# Patient Record
Sex: Female | Born: 1940 | Race: White | Hispanic: No | Marital: Married | Smoking: Light tobacco smoker
Health system: Southern US, Community
[De-identification: ages and names within clinical notes are randomized; demographics above are authoritative.]

## PROBLEM LIST (undated history)

## (undated) DIAGNOSIS — G43909 Migraine, unspecified, not intractable, without status migrainosus: Secondary | ICD-10-CM

## (undated) DIAGNOSIS — K222 Esophageal obstruction: Secondary | ICD-10-CM

## (undated) DIAGNOSIS — H833X9 Noise effects on inner ear, unspecified ear: Secondary | ICD-10-CM

## (undated) DIAGNOSIS — R0989 Other specified symptoms and signs involving the circulatory and respiratory systems: Secondary | ICD-10-CM

## (undated) DIAGNOSIS — K449 Diaphragmatic hernia without obstruction or gangrene: Secondary | ICD-10-CM

## (undated) DIAGNOSIS — K259 Gastric ulcer, unspecified as acute or chronic, without hemorrhage or perforation: Secondary | ICD-10-CM

## (undated) HISTORY — DX: Gastric ulcer, unspecified as acute or chronic, without hemorrhage or perforation: K25.9

## (undated) HISTORY — DX: Diaphragmatic hernia without obstruction or gangrene: K44.9

## (undated) HISTORY — DX: Migraine, unspecified, not intractable, without status migrainosus: G43.909

## (undated) HISTORY — DX: Other specified symptoms and signs involving the circulatory and respiratory systems: R09.89

## (undated) HISTORY — DX: Noise effects on inner ear, unspecified ear: H83.3X9

## (undated) HISTORY — DX: Esophageal obstruction: K22.2

---

## 1968-03-24 HISTORY — PX: ABDOMINAL HYSTERECTOMY: SHX81

## 2005-07-31 ENCOUNTER — Ambulatory Visit: Payer: Self-pay | Admitting: Family Medicine

## 2006-03-10 ENCOUNTER — Telehealth: Payer: Self-pay | Admitting: Family Medicine

## 2006-03-10 ENCOUNTER — Ambulatory Visit: Payer: Self-pay | Admitting: Family Medicine

## 2006-03-10 DIAGNOSIS — I10 Essential (primary) hypertension: Secondary | ICD-10-CM | POA: Insufficient documentation

## 2006-03-10 DIAGNOSIS — G43109 Migraine with aura, not intractable, without status migrainosus: Secondary | ICD-10-CM | POA: Insufficient documentation

## 2006-12-01 ENCOUNTER — Ambulatory Visit: Payer: Self-pay | Admitting: Family Medicine

## 2006-12-01 DIAGNOSIS — H9190 Unspecified hearing loss, unspecified ear: Secondary | ICD-10-CM | POA: Insufficient documentation

## 2006-12-04 ENCOUNTER — Telehealth: Payer: Self-pay | Admitting: Family Medicine

## 2006-12-04 DIAGNOSIS — I6529 Occlusion and stenosis of unspecified carotid artery: Secondary | ICD-10-CM | POA: Insufficient documentation

## 2006-12-08 ENCOUNTER — Encounter: Payer: Self-pay | Admitting: Family Medicine

## 2006-12-09 ENCOUNTER — Encounter: Payer: Self-pay | Admitting: Family Medicine

## 2006-12-09 DIAGNOSIS — E785 Hyperlipidemia, unspecified: Secondary | ICD-10-CM | POA: Insufficient documentation

## 2006-12-09 LAB — CONVERTED CEMR LAB
ALT: 8 units/L (ref 0–35)
CO2: 21 meq/L (ref 19–32)
Cholesterol, target level: 200 mg/dL
Cholesterol: 255 mg/dL — ABNORMAL HIGH (ref 0–200)
Creatinine, Ser: 0.94 mg/dL (ref 0.40–1.20)
HDL: 36 mg/dL — ABNORMAL LOW (ref >39)
Total Bilirubin: 0.3 mg/dL (ref 0.3–1.2)
Total CHOL/HDL Ratio: 7.1
VLDL: 44 mg/dL — ABNORMAL HIGH (ref 0–40)

## 2007-01-20 ENCOUNTER — Encounter: Payer: Self-pay | Admitting: Family Medicine

## 2007-08-05 ENCOUNTER — Encounter: Payer: Self-pay | Admitting: Family Medicine

## 2007-09-27 ENCOUNTER — Ambulatory Visit: Payer: Self-pay | Admitting: Family Medicine

## 2007-10-07 ENCOUNTER — Encounter: Payer: Self-pay | Admitting: Family Medicine

## 2007-10-19 ENCOUNTER — Telehealth: Payer: Self-pay | Admitting: Family Medicine

## 2007-12-23 ENCOUNTER — Encounter: Payer: Self-pay | Admitting: Family Medicine

## 2007-12-24 LAB — CONVERTED CEMR LAB
Albumin: 4.2 g/dL (ref 3.5–5.2)
Alkaline Phosphatase: 64 units/L (ref 39–117)
BUN: 22 mg/dL (ref 6–23)
CO2: 23 meq/L (ref 19–32)
Calcium: 9.7 mg/dL (ref 8.4–10.5)
Chloride: 104 meq/L (ref 96–112)
Cholesterol: 196 mg/dL (ref 0–200)
Glucose, Bld: 101 mg/dL — ABNORMAL HIGH (ref 70–99)
HDL: 44 mg/dL (ref >39)
Potassium: 4.8 meq/L (ref 3.5–5.3)
Triglycerides: 212 mg/dL — ABNORMAL HIGH (ref <150)

## 2008-08-29 ENCOUNTER — Ambulatory Visit: Payer: Self-pay | Admitting: Family Medicine

## 2008-08-29 ENCOUNTER — Encounter: Admission: RE | Admit: 2008-08-29 | Discharge: 2008-08-29 | Payer: Self-pay | Admitting: Family Medicine

## 2008-09-08 ENCOUNTER — Telehealth: Payer: Self-pay | Admitting: Family Medicine

## 2008-09-14 ENCOUNTER — Encounter: Payer: Self-pay | Admitting: Family Medicine

## 2008-10-13 ENCOUNTER — Ambulatory Visit: Payer: Self-pay | Admitting: Vascular Surgery

## 2008-10-13 ENCOUNTER — Encounter: Admission: RE | Admit: 2008-10-13 | Discharge: 2008-10-13 | Payer: Self-pay | Admitting: Family Medicine

## 2008-10-13 ENCOUNTER — Ambulatory Visit: Payer: Self-pay | Admitting: Family Medicine

## 2008-10-13 LAB — CONVERTED CEMR LAB
Basophils Relative: 0 % (ref 0–1)
Eosinophils Absolute: 0 10*3/uL (ref 0.0–0.7)
HCT: 43 % (ref 36.0–46.0)
Hemoglobin: 14.1 g/dL (ref 12.0–15.0)
Lymphs Abs: 1.7 10*3/uL (ref 0.7–4.0)
MCHC: 32.8 g/dL (ref 30.0–36.0)
MCV: 100.6 fL — ABNORMAL HIGH (ref 78.0–100.0)
Monocytes Absolute: 0.5 10*3/uL (ref 0.1–1.0)
Monocytes Relative: 6 % (ref 3–12)
RBC: 4.27 M/uL (ref 3.87–5.11)
WBC: 7.6 10*3/uL (ref 4.0–10.5)

## 2008-10-16 LAB — CONVERTED CEMR LAB
ALT: <8 units/L (ref 0–35)
AST: 14 units/L (ref 0–37)
Albumin: 3.9 g/dL (ref 3.5–5.2)
BUN: 14 mg/dL (ref 6–23)
CO2: 22 meq/L (ref 19–32)
Calcium: 9.2 mg/dL (ref 8.4–10.5)
Chloride: 108 meq/L (ref 96–112)
Creatinine, Ser: 1.13 mg/dL (ref 0.40–1.20)
Potassium: 4.5 meq/L (ref 3.5–5.3)
Sed Rate: 20 mm/hr (ref 0–22)
Uric Acid, Serum: 3.1 mg/dL (ref 2.4–7.0)

## 2008-12-23 ENCOUNTER — Encounter: Payer: Self-pay | Admitting: Family Medicine

## 2009-02-02 ENCOUNTER — Ambulatory Visit: Payer: Self-pay | Admitting: Family Medicine

## 2009-02-02 DIAGNOSIS — K625 Hemorrhage of anus and rectum: Secondary | ICD-10-CM

## 2009-02-02 DIAGNOSIS — K222 Esophageal obstruction: Secondary | ICD-10-CM

## 2009-02-09 ENCOUNTER — Ambulatory Visit: Payer: Self-pay | Admitting: Family Medicine

## 2009-02-22 ENCOUNTER — Encounter: Payer: Self-pay | Admitting: Family Medicine

## 2009-03-24 DIAGNOSIS — K222 Esophageal obstruction: Secondary | ICD-10-CM

## 2009-03-24 HISTORY — DX: Esophageal obstruction: K22.2

## 2009-04-02 ENCOUNTER — Encounter: Payer: Self-pay | Admitting: Family Medicine

## 2010-04-04 ENCOUNTER — Telehealth: Payer: Self-pay | Admitting: Family Medicine

## 2010-04-09 ENCOUNTER — Ambulatory Visit
Admission: RE | Admit: 2010-04-09 | Discharge: 2010-04-09 | Payer: Self-pay | Source: Home / Self Care | Attending: Family Medicine | Admitting: Family Medicine

## 2010-04-09 DIAGNOSIS — R0989 Other specified symptoms and signs involving the circulatory and respiratory systems: Secondary | ICD-10-CM | POA: Insufficient documentation

## 2010-04-17 ENCOUNTER — Encounter: Payer: Self-pay | Admitting: Family Medicine

## 2010-04-23 NOTE — Consult Note (Signed)
Summary: [facility]   Imported By: Lanelle Bal 05/17/2009 13:12:30  _____________________________________________________________________  External Attachment:    Type:   Image     Comment:   External Document

## 2010-04-23 NOTE — Procedures (Signed)
Summary: Upper GI Endoscopy/[facility]  Upper GI Endoscopy/[facility]   Imported By: Lanelle Bal 04/30/2009 10:23:37  _____________________________________________________________________  External Attachment:    Type:   Image     Comment:   External Document  Appended Document: Upper GI Endoscopy/[facility]

## 2010-04-25 NOTE — Assessment & Plan Note (Signed)
Summary: F/u on BP and meds   Vital Signs:  Patient profile:   70 year old female Height:      62 inches Weight:      106 pounds Pulse rate:   76 / minute BP sitting:   147 / 65  (right arm) Cuff size:   regular  Vitals Entered By: Avon Gully CMA, (AAMA) (April 09, 2010 11:03 AM) CC: f/u meds and BP   Primary Care Provider:  Linford Arnold, C  CC:  f/u meds and BP.  History of Present Illness: Jsut founds out her grandaughter has a bleed in her brain. Found out alst week.  Hasn't been sleeping well since found this out.  Tried some Tylenol PM and it made her legs cramps. Exercising to help her blood pressure.   Current Medications (verified): 1)  Bisoprolol-Hydrochlorothiazide 5-6.25 Mg Tabs (Bisoprolol-Hydrochlorothiazide) .... Take 1 Tablet By Mouth Two Times A Day 2)  Vit D3 3)  B12  Allergies (verified): No Known Drug Allergies  Comments:  Nurse/Medical Assistant: The patient's medications and allergies were reviewed with the patient and were updated in the Medication and Allergy Lists. 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 Noelle Penner at [facility]  Social History: Reviewed history from 12/30/2005 and no changes required. Retired.  Smokes 3 cig/day, no EtOH, no drugs, 1/2 caff/day, rides bicycle and walks for exercise.  Physical Exam  General:  Well-developed,well-nourished,in no acute distress; alert,appropriate and cooperative throughout examination Neck:  No deformities, masses, or tenderness noted. Lungs:  Normal respiratory effort, chest expands symmetrically. Lungs are clear to auscultation, no crackles or wheezes. Heart:  Normal rate and regular rhythm. S1 and S2 normal without gallop, murmur, click, rub or other  extra sounds. Bilat carotid bruis. Left renal bruist and possible soft one on the right.  Abdomen:  Bowel sounds positive,abdomen soft and non-tender without masses, organomegaly or hernias noted. Pulses:  Radila 2+  Extremities:  NO LE edema.  Skin:  no rashes.   Psych:  Cognition and judgment appear intact. Alert and cooperative with normal attention span and concentration. No apparent delusions, illusions, hallucinations   Impression & Recommendations:  Problem # 1:  HYPERTENSION, BENIGN SYSTEMIC (ICD-401.1) Elevted today. Usually very well controlled. She is very stressed adn not sleeping well and is very tearful in the office today. Asked her to return in 2-3 weeeks to recheck.  Overdue for labwork.  Her updated medication list for this problem includes:    Bisoprolol-hydrochlorothiazide 5-6.25 Mg Tabs (Bisoprolol-hydrochlorothiazide) .Marland Kitchen... Take 1 tablet by mouth two times a day  BP today: 147/65 Prior BP: 112/67 (02/09/2009)  Prior 10 Yr Risk Heart Disease: 11 % (12/24/2007)  Labs Reviewed: K+: 4.5 (10/13/2008) Creat: : 1.13 (10/13/2008)   Chol: 196 (12/23/2007)   HDL: 44 (12/23/2007)   LDL: 110 (12/23/2007)   TG: 212 (12/23/2007)  Orders: T-Comprehensive Metabolic Panel ([id number]) T-Lipid Profile (29562-13086)  Problem # 2:  ABDOMINAL BRUIT (ICD-785.9)  Will refer for renal US to eval for the bruits.   Orders: T-*Unlisted Diagnostic X-ray test/procedure (57846)  Problem # 3:  CAROTID BRUITS, BILATERAL (ICD-785.9)  Has been over 2 years since last [place].  Due to repeat at [facility].   Orders: T-*Unlisted Diagnostic X-ray test/procedure [id number])  Complete Medication List: 1)  Bisoprolol-hydrochlorothiazide 5-6.25 Mg Tabs (  Bisoprolol-hydrochlorothiazide) .... Take 1 tablet by mouth two times a day 2)  Vitamin D3 1000 Unit Tabs (Cholecalciferol) .... Take 1 tablet by mouth once a day 3)  B12  4)  Fish Oil 1000 Mg Caps (Omega-3 fatty acids) .... Take 1 tablet by mouth  two times a day  Patient Instructions: 1)  We will call you with the appt for the the carotid and renal ultrasounds.  Prescriptions: BISOPROLOL-HYDROCHLOROTHIAZIDE 5-6.25 MG TABS (BISOPROLOL-HYDROCHLOROTHIAZIDE) Take 1 tablet by mouth two times a day  #60 x 6   Entered and Authorized by:   Nani Gasser MD   Signed by:   Nani Gasser MD on 04/09/2010   Method used:   [facility]ronically to        [facility] [phone]* (retail)       [address]       [place], [place]  [address]       Ph: [phone] or [phone]       Fax: [phone]   RxID:   [id number]    Orders Added: 1)  T-Comprehensive Metabolic Panel [[id number]] 2)  T-Lipid Profile [[id number]] 3)  Est. Patient Level IV [[address]644] 4)  T-*Unlisted Diagnostic X-ray test/procedure [03474]

## 2010-04-25 NOTE — Progress Notes (Signed)
  Phone Note Call from Patient   Caller: Patient Call For: Nani Gasser MD Summary of Call: pt scheduled a f/u appt for 04/09/10 and called and states she needs a refill of Bp meds because she is out Initial call taken by: Avon Gully CMA, Duncan Dull),  April 04, 2010 3:54 PM    Prescriptions: BISOPROLOL-HYDROCHLOROTHIAZIDE 5-6.25 MG TABS (BISOPROLOL-HYDROCHLOROTHIAZIDE) Take 1 tablet by mouth two times a day  #30 x 0   Entered by:   Avon Gully CMA, (AAMA)   Authorized by:   Nani Gasser MD   Signed by:   Avon Gully CMA, (AAMA) on 04/04/2010   Method used:   [facility] to        [facility] [phone][facility] (retail[facility]       [address] [place], [place]  [address]       Ph: [phone] or [phone]       Fax: [phone]   RxID:   [id number]

## 2010-05-01 ENCOUNTER — Telehealth: Payer: Self-pay | Admitting: Family Medicine

## 2010-05-09 NOTE — Progress Notes (Signed)
Summary: Carotid artery  Phone Note From Pharmacy   Summary of Call: Call ptL Carotid US showed some moderate plaque. They didn't comment if had progressed or not.  Since narrowing is 51% and 58% then usually we treat medically uncless have sxs like dizziness or lightheadedness. I would like her to see a Cardiovascular specialist though to help follow this and make recommendations. Has she seen one in the past? Also has some narrowing in teh arteries in her abdomen.  Also need to f/u soone for repeat BP check. Can do with nurse if would like.  Initial call taken by: Nani Gasser MD,  May 01, 2010 1:07 PM  Follow-up for Phone Call        pt notified.pt does not want referral at this time because she has family issues and will call back when she is ready for [place] to do referral Follow-up by: Avon Gully CMA, Duncan Dull),  May 02, 2010 12:05 PM

## 2010-05-23 ENCOUNTER — Encounter: Payer: Self-pay | Admitting: Family Medicine

## 2010-05-23 ENCOUNTER — Ambulatory Visit (INDEPENDENT_AMBULATORY_CARE_PROVIDER_SITE_OTHER): Payer: Medicare Other | Admitting: Family Medicine

## 2010-05-23 DIAGNOSIS — I10 Essential (primary) hypertension: Secondary | ICD-10-CM

## 2010-05-24 LAB — CONVERTED CEMR LAB
ALT: <8 units/L (ref 0–35)
AST: 13 units/L (ref 0–37)
Alkaline Phosphatase: 58 units/L (ref 39–117)
BUN: 18 mg/dL (ref 6–23)
Calcium: 9.3 mg/dL (ref 8.4–10.5)
Chloride: 106 meq/L (ref 96–112)
Creatinine, Ser: 1.02 mg/dL (ref 0.40–1.20)
HDL: 41 mg/dL (ref >39)
LDL Cholesterol: 178 mg/dL — ABNORMAL HIGH (ref 0–99)
Total CHOL/HDL Ratio: 6.7
VLDL: 54 mg/dL — ABNORMAL HIGH (ref 0–40)

## 2010-05-30 NOTE — Assessment & Plan Note (Signed)
Summary: bp ck   Vital Signs:  Patient profile:   70 year old female Height:      62 inches Weight:      106 pounds Pulse rate:   58 / minute BP sitting:   115 / 61  (right arm) Cuff size:   regular  Vitals Entered By: Avon Gully CMA, Duncan Dull) (May 23, 2010 9:51 AM) CC: f/u BP, Hypertension Management   Primary Care Provider:  Linford Arnold, C  CC:  f/u BP and Hypertension Management.  History of Present Illness: BP was really elevated  Hypertension History:      She denies headache, chest pain, palpitations, dyspnea with exertion, orthopnea, PND, peripheral edema, visual symptoms, neurologic problems, syncope, and side effects from treatment.  She notes no problems with any antihypertensive medication side effects.        Positive major cardiovascular risk factors include female age 75 years old or older, hyperlipidemia, hypertension, and current tobacco user.  Negative major cardiovascular risk factors include no history of diabetes and negative family history for ischemic heart disease.        Further assessment for target organ damage reveals no history of ASHD, cardiac end-organ damage (CHF/LVH), stroke/TIA, peripheral vascular disease, renal insufficiency, or hypertensive retinopathy.     Current Medications (verified): 1)  Bisoprolol-Hydrochlorothiazide 5-6.25 Mg Tabs (Bisoprolol-Hydrochlorothiazide) .... Take 1 Tablet By Mouth Two Times A Day 2)  Vitamin D3 1000 Unit Tabs (Cholecalciferol) .... Take 1 Tablet By Mouth Once A Day 3)  B12 4)  Fish Oil 1000 Mg Caps (Omega-3 Fatty Acids) .... Take 1 Tablet By Mouth Two Times A Day  Allergies (verified): No Known Drug Allergies  Comments:  Nurse/Medical Assistant: The patient's medications and allergies were reviewed with the patient and were updated in the Medication and Allergy Lists. Avon Gully CMA, Duncan Dull) (May 23, 2010 9:52 AM)  Physical Exam  General:  Well-developed,well-nourished,in no acute distress;  alert,appropriate and cooperative throughout examination Head:  Normocephalic and atraumatic without obvious abnormalities. No apparent alopecia or balding. Lungs:  Normal respiratory effort, chest expands symmetrically. Coarse expiratory wheeze.   Heart:  Normal rate and regular rhythm. S1 and S2 normal without gallop, murmur, click, rub or other extra sounds.   Impression & Recommendations:  Problem # 1:  HYPERTENSION, BENIGN SYSTEMIC (ICD-401.1) Much better today. No CP or SOB.  f/U in 6 month.  Her updated medication list for this problem includes:    Bisoprolol-hydrochlorothiazide 5-6.25 Mg Tabs (Bisoprolol-hydrochlorothiazide) .Marland Kitchen... Take 1 tablet by mouth two times a day  BP today: 115/61 Prior BP: 147/65 (04/09/2010)  Prior 10 Yr Risk Heart Disease: 11 % (12/24/2007)  Labs Reviewed: K+: 4.5 (10/13/2008) Creat: : 1.13 (10/13/2008)   Chol: 196 (12/23/2007)   HDL: 44 (12/23/2007)   LDL: 110 (12/23/2007)   TG: 212 (12/23/2007)  Complete Medication List: 1)  Bisoprolol-hydrochlorothiazide 5-6.25 Mg Tabs (Bisoprolol-hydrochlorothiazide) .... Take 1 tablet by mouth two times a day 2)  Vitamin D3 1000 Unit Tabs (Cholecalciferol) .... Take 1 tablet by mouth once a day 3)  B12  4)  Fish Oil 1000 Mg Caps (Omega-3 fatty acids) .... Take 1 tablet by mouth two times a day  Hypertension Assessment/Plan:      The patient's hypertensive risk group is category B: At least one risk factor (excluding diabetes) with no target organ damage.  Her calculated 10 year risk of coronary heart disease is 11 %.  Today's blood pressure is 115/61.  Her blood pressure goal is <  140/90.  Patient Instructions: 1)  When feeling better lets schedule a breathing test here in the office called spirometry.   2)  Please schedule a follow-up appointment in 6 months for blood pressure .    Orders Added: 1)  Est. Patient Level II [[address]]

## 2010-06-03 ENCOUNTER — Encounter (INDEPENDENT_AMBULATORY_CARE_PROVIDER_SITE_OTHER): Payer: Self-pay | Admitting: *Deleted

## 2010-06-06 ENCOUNTER — Telehealth: Payer: Self-pay | Admitting: Family Medicine

## 2010-06-11 NOTE — Progress Notes (Signed)
Summary: return call about labs  Phone Note Call from Patient   Caller: Patient [phone] Summary of Call: Pt [facility] stating she received the letter and is now available to discuss labs. Please call Pt back to discuss. Initial call taken by: Payton Spark CMA,  June 06, 2010 2:17 PM  Follow-up for Phone Call        Called Pt back [facility] for RC. Follow-up by: Payton Spark CMA,  June 06, 2010 2:19 PM  Additional Follow-up for Phone Call Additional follow up Details #1::        pt notified about chol and it ok  that she starts chol med  and she states she does not want lipitor.[facility] Additional Follow-up by: Avon Gully CMA, Duncan Dull),  June 07, 2010 2:01 PM    New/Updated Medications: CRESTOR 20 MG TABS (ROSUVASTATIN CALCIUM) Take 1 tablet by mouth once a day Prescriptions: CRESTOR 20 MG TABS (ROSUVASTATIN CALCIUM) Take 1 tablet by mouth once a day  #30 x 1   Entered and Authorized by:   Nani Gasser MD   Signed by:   Nani Gasser MD on 06/07/2010   Method used:   [facility] to        [facility] [phone]* (retail)       [address]       [place], [place]  [address]       Ph: [phone] or [phone]       Fax: [phone]   RxID:   [id number]

## 2010-06-11 NOTE — Letter (Signed)
Summary: Generic Letter  [facility]  [address], [address]   [place], [place] [address]   Phone: [phone]  Fax: [phone]    06/03/2010  [facility] [address] [place], [place]  [address]  [place]  Dear Ms. Dobler,    We have been unablet to reach you by phone. Enclosed is a copy of your labs and a brief explanation. Please call back if you have any questions.       Sincerely,  Nani Gasser, MD

## 2010-08-01 ENCOUNTER — Other Ambulatory Visit: Payer: Self-pay | Admitting: Family Medicine

## 2010-08-06 NOTE — Procedures (Signed)
DUPLEX DEEP VENOUS EXAM - LOWER EXTREMITY   INDICATION:  Right lower extremity pain and swelling with elevated D-  dimer.   HISTORY:  Edema:  Yes.  Trauma/Surgery:  No.  Pain:  Yes.  PE:  No.  Previous DVT:  No.  Anticoagulants:  No.  Other:   DUPLEX EXAM:                CFV   SFV   PopV  PTV    GSV                R  L  R  L  R  L  R   L  R  L  Thrombosis    0  0  0     0     0      0  Spontaneous   +  +  +     +     +      +  Phasic        +  +  +     +     +      +  Augmentation  +  +  +     +     +      +  Compressible  +  +  +     +     +      +  Competent     +  +  +     +     +      +   Legend:  + - yes  o - no  p - partial  D - decreased   IMPRESSION:  1. No evidence of deep venous thrombosis noted in the right leg.  2. Notified Dr. Linford Arnold with results.    _____________________________  Di Kindle. Edilia Bo, M.D.   MG/MEDQ  D:  10/13/2008  T:  10/14/2008  Job:  [id number]

## 2010-09-30 ENCOUNTER — Other Ambulatory Visit: Payer: Self-pay | Admitting: Family Medicine

## 2010-11-24 ENCOUNTER — Other Ambulatory Visit: Payer: Self-pay | Admitting: Family Medicine

## 2010-11-26 ENCOUNTER — Other Ambulatory Visit: Payer: Self-pay | Admitting: Family Medicine

## 2010-12-09 ENCOUNTER — Telehealth: Payer: Self-pay | Admitting: Family Medicine

## 2010-12-09 MED ORDER — BISOPROLOL-HYDROCHLOROTHIAZIDE 5-6.25 MG PO TABS: 1.0000 | ORAL_TABLET | Freq: Two times a day (BID) | ORAL | Status: DC

## 2010-12-09 NOTE — Telephone Encounter (Signed)
Pt called and said she is running short on her Refill because only 30 sent the last time and she usually get # 60 of the bisoprolol HCTZ 5-6.25 mg.  Last refill was on 11-24-10, and pt would defintely be out of med on the 16th.   Plan:  Pt is due for 6 mth follow up of her BP, and probable reason only sent the # 30 pills.  Will refill pt medication til 12-26-10 at which time the pt has an appt fup for the BP.  [facility] # [facility] Jarvis Newcomer, LPN Domingo Dimes

## 2010-12-17 ENCOUNTER — Encounter: Payer: Self-pay | Admitting: Family Medicine

## 2010-12-26 ENCOUNTER — Encounter: Payer: Self-pay | Admitting: Family Medicine

## 2010-12-26 ENCOUNTER — Ambulatory Visit (INDEPENDENT_AMBULATORY_CARE_PROVIDER_SITE_OTHER): Payer: Medicare Other | Admitting: Family Medicine

## 2010-12-26 VITALS — BP 123/67 | HR 64 | Wt 102.0 lb

## 2010-12-26 DIAGNOSIS — I10 Essential (primary) hypertension: Secondary | ICD-10-CM

## 2010-12-26 DIAGNOSIS — E785 Hyperlipidemia, unspecified: Secondary | ICD-10-CM

## 2010-12-26 DIAGNOSIS — Z23 Encounter for immunization: Secondary | ICD-10-CM

## 2010-12-26 MED ORDER — BISOPROLOL-HYDROCHLOROTHIAZIDE 5-6.25 MG PO TABS: 1.0000 | ORAL_TABLET | Freq: Two times a day (BID) | ORAL | Status: DC

## 2010-12-26 MED ORDER — AMBULATORY NON FORMULARY MEDICATION: Status: DC

## 2010-12-26 NOTE — Patient Instructions (Signed)
Think about getting your mammogram.  We will call you with your lab results after you go.

## 2010-12-26 NOTE — Progress Notes (Signed)
  Subjective:    Patient ID: Stacey Grimes, female    DOB: 02-09-1941, 70 y.o.   MRN: [id number]  Hypertension This is a chronic problem. The current episode started more than 1 month ago. The problem is unchanged. Pertinent negatives include no chest pain or shortness of breath. There are no associated agents to hypertension. Risk factors for coronary artery disease include sedentary lifestyle and dyslipidemia. Past treatments include beta blockers and diuretics. There are no compliance problems.   Hyperlipidemia This is a chronic problem. The problem is controlled. Factors aggravating her hyperlipidemia include no known factors. Pertinent negatives include no chest pain or shortness of breath. Current antihyperlipidemic treatment includes exercise and statins. The current treatment provides moderate improvement of lipids. There are no compliance problems.  Risk factors for coronary artery disease include dyslipidemia and post-menopausal.    Review of Systems  Respiratory: Negative for shortness of breath.   Cardiovascular: Negative for chest pain.       Objective:   Physical Exam  Constitutional: She is oriented to person, place, and time. She appears well-developed and well-nourished.  HENT:  Head: Normocephalic and atraumatic.  Eyes: Pupils are equal, round, and reactive to light.  Cardiovascular: Normal rate, regular rhythm and normal heart sounds.        No carotid bruits.   Pulmonary/Chest: Effort normal and breath sounds normal.  Neurological: She is alert and oriented to person, place, and time.  Skin: Skin is warm and dry.  Psychiatric: She has a normal mood and affect. Her behavior is normal.          Assessment & Plan:  HTN - Borderline today. Just took BP meds this AM.  Doing well. Has really made some good lifestyles changes and is exercising regularly. Recheck in 6 months.   Hyperlipidemia - Recheck levels and see if helping. Given lab slip. No Se of the statin     Flu and pneumonia vaccines given.

## 2011-01-12 IMAGING — CR DG HIP COMPLETE 2+V*R*
2 series · 2 of 2 positions shown · non-contrast
Comparison: None

CLINICAL DATA: Fell on 08/05/2008 with right hip pain

RIGHT HIP - COMPLETE 2+ VIEW

[view not recorded (1 of 2)]
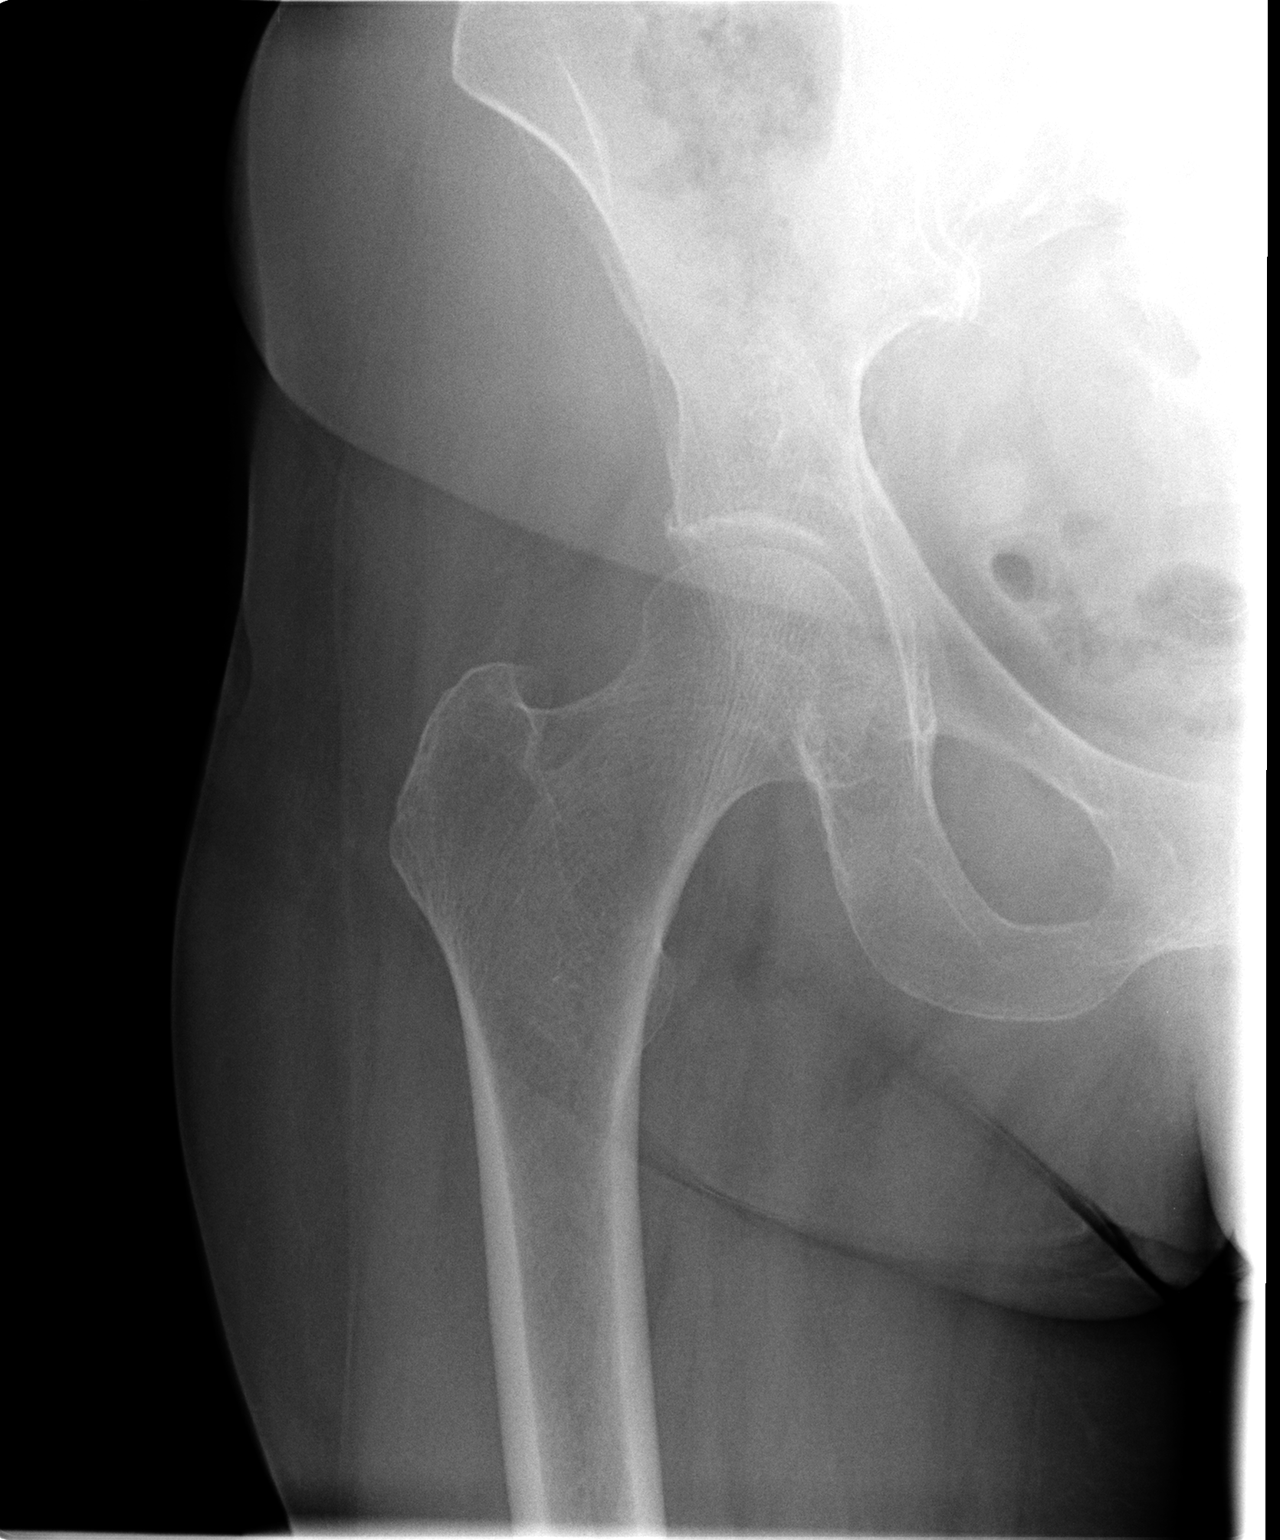

[view not recorded (2 of 2)]
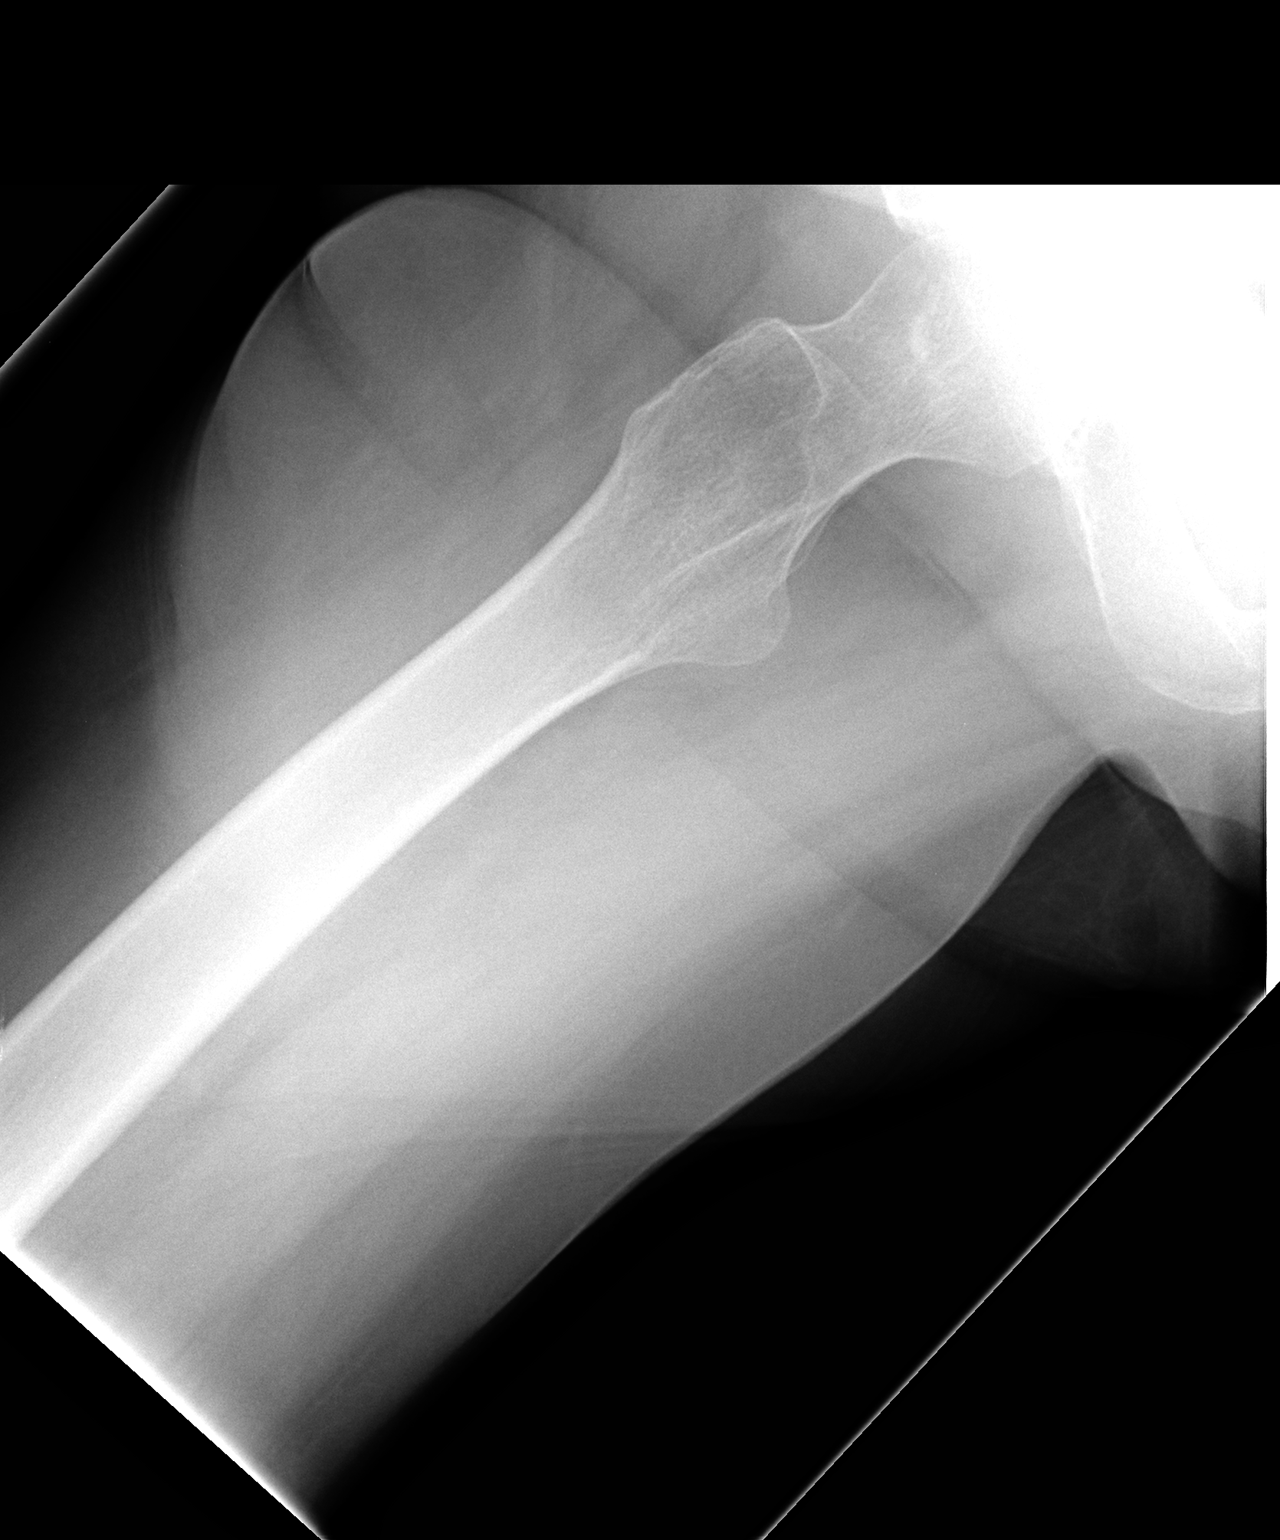

[2 of 2 positions shown; findings below may reference images not displayed]

FINDINGS: No acute fracture is seen.  The right hip joint space is
normal.  The right ramus is intact.  The right SI joint appears
normally corticated.
IMPRESSION: Negative right hip.

## 2011-01-14 LAB — HEPATIC FUNCTION PANEL
ALT: <8 U/L (ref 0–35)
AST: 20 U/L (ref 0–37)
Albumin: 4.2 g/dL (ref 3.5–5.2)
Alkaline Phosphatase: 57 U/L (ref 39–117)
Total Protein: 7.2 g/dL (ref 6.0–8.3)

## 2011-01-14 LAB — BASIC METABOLIC PANEL WITH GFR
BUN: 19 mg/dL (ref 6–23)
CO2: 24 mEq/L (ref 19–32)
Chloride: 106 mEq/L (ref 96–112)
Creat: 1.11 mg/dL — ABNORMAL HIGH (ref 0.50–1.10)
GFR, Est Non African American: 51 mL/min — ABNORMAL LOW (ref >90)
Glucose, Bld: 92 mg/dL (ref 70–99)
Potassium: 4.5 mEq/L (ref 3.5–5.3)

## 2011-01-14 LAB — LIPID PANEL
Cholesterol: 172 mg/dL (ref 0–200)
Triglycerides: 230 mg/dL — ABNORMAL HIGH (ref <150)

## 2011-01-16 ENCOUNTER — Telehealth: Payer: Self-pay | Admitting: *Deleted

## 2011-01-16 NOTE — Telephone Encounter (Signed)
Message copied by Wyline Beady on Thu Jan 16, 2011  8:54 AM ------      Message from: Nani Gasser D      Created: Thu Jan 16, 2011  6:34 AM       Overall her total cholesterol and LDL looks so much better. They're goal. Her triglycerides a little bit better to, but they're still elevated at 230. Goal is under 150. She can consider adding fish oil to her daily regimen. 2-4 tabs daily. Then can recheck cholesterol in 6 months.continue all medications.

## 2011-01-16 NOTE — Telephone Encounter (Signed)
Pt.notified

## 2011-01-22 ENCOUNTER — Other Ambulatory Visit: Payer: Self-pay | Admitting: Family Medicine

## 2011-04-19 ENCOUNTER — Other Ambulatory Visit: Payer: Self-pay | Admitting: Family Medicine

## 2011-05-20 ENCOUNTER — Other Ambulatory Visit: Payer: Self-pay | Admitting: *Deleted

## 2011-05-20 MED ORDER — BISOPROLOL-HYDROCHLOROTHIAZIDE 5-6.25 MG PO TABS: 1.0000 | ORAL_TABLET | Freq: Two times a day (BID) | ORAL | Status: DC

## 2012-01-01 ENCOUNTER — Other Ambulatory Visit: Payer: Self-pay | Admitting: Family Medicine

## 2012-01-01 NOTE — Telephone Encounter (Signed)
Must schedule appointment

## 2012-01-30 ENCOUNTER — Ambulatory Visit (INDEPENDENT_AMBULATORY_CARE_PROVIDER_SITE_OTHER): Payer: Medicare Other | Admitting: Family Medicine

## 2012-01-30 ENCOUNTER — Encounter: Payer: Self-pay | Admitting: Family Medicine

## 2012-01-30 VITALS — BP 153/75 | HR 72 | Ht 62.0 in | Wt 96.0 lb

## 2012-01-30 DIAGNOSIS — I10 Essential (primary) hypertension: Secondary | ICD-10-CM

## 2012-01-30 DIAGNOSIS — Z23 Encounter for immunization: Secondary | ICD-10-CM

## 2012-01-30 DIAGNOSIS — E785 Hyperlipidemia, unspecified: Secondary | ICD-10-CM

## 2012-01-30 DIAGNOSIS — F17200 Nicotine dependence, unspecified, uncomplicated: Secondary | ICD-10-CM

## 2012-01-30 DIAGNOSIS — Z720 Tobacco use: Secondary | ICD-10-CM

## 2012-01-30 MED ORDER — BISOPROLOL-HYDROCHLOROTHIAZIDE 5-6.25 MG PO TABS: 1.0000 | ORAL_TABLET | Freq: Two times a day (BID) | ORAL | Status: DC

## 2012-01-30 MED ORDER — ROSUVASTATIN CALCIUM 20 MG PO TABS: 20.0000 mg | ORAL_TABLET | Freq: Every day | ORAL | Status: DC

## 2012-01-30 MED ORDER — AMBULATORY NON FORMULARY MEDICATION: Status: DC

## 2012-01-30 NOTE — Patient Instructions (Addendum)
Call me if you Blood pressure is over 140 and we can increase your medicine.  Follow up in January to discuss your leg.

## 2012-01-30 NOTE — Progress Notes (Signed)
  Subjective:    Patient ID: Stacey Grimes, female    DOB: 1940-11-08, 71 y.o.   MRN: [id number]  Hypertension This is a chronic problem. The current episode started today. The problem is controlled. Pertinent negatives include no chest pain or peripheral edema. Past treatments include beta blockers and diuretics. The current treatment provides moderate improvement. There are no compliance problems.   Hyperlipidemia This is a chronic problem. The problem is controlled. Pertinent negatives include no chest pain. There are no compliance problems.    She physically active with yard work.    Tob abuse - Still smoking.  She has been thinking about the idea of quitting. She says she doesn't want to take a medication to do it but she certainly thinking about it and noticed that she really should quit.   Review of Systems  Cardiovascular: Negative for chest pain.       Objective:   Physical Exam  Constitutional: She is oriented to person, place, and time. She appears well-developed and well-nourished.  HENT:  Head: Normocephalic and atraumatic.  Neck: Neck supple. No thyromegaly present.  Cardiovascular: Normal rate, regular rhythm and normal heart sounds.        No carotid or abdominal bruits.    Pulmonary/Chest: Effort normal and breath sounds normal.  Abdominal: Soft. Bowel sounds are normal. She exhibits no distension and no mass. There is no tenderness. There is no rebound and no guarding.  Musculoskeletal: She exhibits no edema.  Lymphadenopathy:    She has no cervical adenopathy.  Neurological: She is alert and oriented to person, place, and time.  Skin: Skin is warm and dry.  Psychiatric: She has a normal mood and affect. Her behavior is normal.          Assessment & Plan:  HTN -Not well controlled. I really wanted to increase her medication but she really wanted to hold off. She took her medication fairly today and did not take a.m. when she normally does. She says she got  busy and forgot and didn't take it until about 10 AM. Her repeat blood pressure was better after an hour here in the office. Continue current regimen. Followup in 2 months.  Hyperlipidemia - repeat labs to make sure well controlled. Continue current regimen. Refill sent to the pharmacy.  Tob abuse - Encouraged cessation.  Offered to assist her in anyway she is ready. She says she's not quite ready but is certainly thinking about it. Handout given.  She says she also wants to talk to me about her leg pain but wants to wait until January. She says she's too busy to deal with any actual workup and so wants to wait until then. She says in ongoing is not acute. He says she has to rest her leg after she's been very active in the yard.

## 2012-03-05 ENCOUNTER — Ambulatory Visit (INDEPENDENT_AMBULATORY_CARE_PROVIDER_SITE_OTHER): Payer: Medicare Other

## 2012-03-05 ENCOUNTER — Ambulatory Visit (INDEPENDENT_AMBULATORY_CARE_PROVIDER_SITE_OTHER): Payer: Medicare Other | Admitting: Sports Medicine

## 2012-03-05 ENCOUNTER — Encounter: Payer: Self-pay | Admitting: Sports Medicine

## 2012-03-05 VITALS — BP 143/75 | HR 79 | Wt 93.0 lb

## 2012-03-05 DIAGNOSIS — IMO0002 Reserved for concepts with insufficient information to code with codable children: Secondary | ICD-10-CM

## 2012-03-05 DIAGNOSIS — M5416 Radiculopathy, lumbar region: Secondary | ICD-10-CM | POA: Insufficient documentation

## 2012-03-05 DIAGNOSIS — M949 Disorder of cartilage, unspecified: Secondary | ICD-10-CM

## 2012-03-05 MED ORDER — PREDNISONE 50 MG PO TABS: ORAL_TABLET | ORAL | Status: DC

## 2012-03-05 MED ORDER — MELOXICAM 15 MG PO TABS: ORAL_TABLET | ORAL | Status: DC

## 2012-03-05 NOTE — Progress Notes (Signed)
SPORTS MEDICINE CONSULTATION REPORT  Subjective:    I'm seeing this patient as a consultation for:  Dr. Linford Arnold  CC: Right leg pain  HPI: This is a very pleasant 71 year old female who comes in with a long history of pain that she localizes over her right hip, radiating from her back, over the anterolateral thigh, lateral leg, into the bottom of her right foot. She describes it as burning, and numbness. It is not worse with Valsalva, it is no worse with flexion or extension. She denies any new onset bowel or bladder incontinence, or fevers, chills, night sweats. She has never had her lumbar spine evaluated. The pain radiates as above, is severe.  Past medical history, Surgical history, Family history, Social history, Allergies, and medications have been entered into the medical record, reviewed, and no changes needed.   Review of Systems: No headache, visual changes, nausea, vomiting, diarrhea, constipation, dizziness, abdominal pain, skin rash, fevers, chills, night sweats, weight loss, swollen lymph nodes, body aches, joint swelling, muscle aches, chest pain, shortness of breath, mood changes, visual or auditory hallucinations.   Objective:   Vitals:  Afebrile, vital signs stable. General: Well Developed, well nourished, and in no acute distress.  Neuro/Psych: Alert and oriented x3, extra-ocular muscles intact, able to move all 4 extremities.  Skin: Warm and dry, no rashes noted.  Respiratory: Not using accessory muscles, speaking in full sentences, trachea midline.  Cardiovascular: Pulses palpable, no extremity edema. Abdomen: Does not appear distended. Back Exam:  Inspection: Unremarkable  Motion: Flexion 45 deg, Extension 45 deg, Side Bending to 45 deg bilaterally,  Rotation to 45 deg bilaterally  SLR laying: Negative  XSLR laying: Negative  Palpable tenderness: None. FABER: negative. Sensory change: Sensation is grossly hypostatic to a right-sided S1 versus L5  distribution. Reflexes: 2+ at both patellar tendons, 2+ at achilles tendons, Babinski's downgoing.  Strength at foot  Plantar-flexion: 5/5 Dorsi-flexion: 5/5 Eversion: 5/5 Inversion: 5/5  Leg strength  Quad: 5/5 Hamstring: 5/5 Hip flexor: 5/5 Hip abductors: 5/5  Gait unremarkable.  I reviewed her x-rays: They are essentially negative  Impression and Recommendations:   This case required medical decision making of moderate complexity.

## 2012-03-05 NOTE — Assessment & Plan Note (Addendum)
2 years of pain she localizes radiating down the anterolateral, and posterior lateral aspect of her right leg, down to the bottom of her right foot suggestive of right-sided S1 radiculitis. Her back has never been evaluated. Though her x-rays are normal, she likely has a large disc protrusion. She will need to do prednisone, Mobic, formal physical therapy. We will do this for 4 weeks, if no better she will get an open MRI for interventional injection planning.

## 2012-03-10 ENCOUNTER — Ambulatory Visit: Payer: Medicare Other | Attending: Sports Medicine | Admitting: Physical Therapy

## 2012-03-10 DIAGNOSIS — M545 Low back pain, unspecified: Secondary | ICD-10-CM | POA: Insufficient documentation

## 2012-03-10 DIAGNOSIS — IMO0001 Reserved for inherently not codable concepts without codable children: Secondary | ICD-10-CM | POA: Insufficient documentation

## 2012-03-15 ENCOUNTER — Ambulatory Visit: Payer: Medicare Other | Admitting: Physical Therapy

## 2012-03-18 ENCOUNTER — Ambulatory Visit: Payer: Medicare Other | Admitting: Physical Therapy

## 2012-03-25 ENCOUNTER — Ambulatory Visit: Payer: Medicare Other | Attending: Sports Medicine | Admitting: Physical Therapy

## 2012-03-25 DIAGNOSIS — IMO0001 Reserved for inherently not codable concepts without codable children: Secondary | ICD-10-CM | POA: Insufficient documentation

## 2012-03-25 DIAGNOSIS — M545 Low back pain, unspecified: Secondary | ICD-10-CM | POA: Insufficient documentation

## 2012-03-26 ENCOUNTER — Telehealth: Payer: Self-pay | Admitting: *Deleted

## 2012-03-26 MED ORDER — GABAPENTIN 300 MG PO CAPS: ORAL_CAPSULE | ORAL | Status: DC

## 2012-03-26 MED ORDER — TRAMADOL HCL 50 MG PO TABS: 50.0000 mg | ORAL_TABLET | Freq: Three times a day (TID) | ORAL | Status: DC | PRN

## 2012-03-26 NOTE — Telephone Encounter (Signed)
Pt states she has appt with you on Monday and she is asking for something different for pain. States the Meloxicam isn't helping. On Monday she is scheduled to f/u after PT and it states she wants to discuss pain med. Please advise.

## 2012-03-26 NOTE — Telephone Encounter (Signed)
Pt.notified

## 2012-03-26 NOTE — Telephone Encounter (Signed)
Pt calls back again and states in a lot of pain and needs something stronger for the pain rather than Meloxicam. States leg gives out on her and looses the feeling in it. Using crutches most of the time to move around.

## 2012-03-26 NOTE — Telephone Encounter (Signed)
Adding tramadol and gabapentin.

## 2012-03-29 ENCOUNTER — Ambulatory Visit (INDEPENDENT_AMBULATORY_CARE_PROVIDER_SITE_OTHER): Payer: Medicare Other | Admitting: Sports Medicine

## 2012-03-29 ENCOUNTER — Encounter: Payer: Self-pay | Admitting: Sports Medicine

## 2012-03-29 VITALS — BP 110/68 | HR 73 | Ht 62.0 in | Wt 95.0 lb

## 2012-03-29 DIAGNOSIS — IMO0002 Reserved for concepts with insufficient information to code with codable children: Secondary | ICD-10-CM

## 2012-03-29 DIAGNOSIS — M5416 Radiculopathy, lumbar region: Secondary | ICD-10-CM

## 2012-03-29 MED ORDER — DIAZEPAM 5 MG PO TABS: ORAL_TABLET | ORAL | Status: DC

## 2012-03-29 NOTE — Assessment & Plan Note (Addendum)
She has failed oral medications, and rehabilitation. We are going to get an MRI likely in the open scanner with pre-procedure anxiolysis. MRI will be for interventional injection planning. She will come back to see me after the MRI.  She does have some element of depressed mood and we certainly need to keep this in the back of her minds as a possible cause of her symptoms should she have a completely negative MRI.

## 2012-03-29 NOTE — Progress Notes (Signed)
SPORTS MEDICINE CONSULTATION REPORT  Subjective:    CC: Followup  HPI: Stacey Grimes is a very pleasant 72 year old female who comes in with pain radiating down her right buttock in an L5 versus S1 type distribution since her last visit. She did not use her gabapentin, but did do prednisone which helped her symptoms significantly. She also did some formal therapy. Unfortunately her pain is persistent. Her x-rays recently were negative. The pain is moderate.  Past medical history, Surgical history, Family history, Social history, Allergies, and medications have been entered into the medical record, reviewed, and no changes needed.   Review of Systems: No headache, visual changes, nausea, vomiting, diarrhea, constipation, dizziness, abdominal pain, skin rash, fevers, chills, night sweats, weight loss, swollen lymph nodes, body aches, joint swelling, muscle aches, chest pain, shortness of breath, mood changes, visual or auditory hallucinations.   Objective:   Vitals:  Afebrile, vital signs stable. General: Well Developed, well nourished, and in no acute distress.  Neuro/Psych: Alert and oriented x3, extra-ocular muscles intact, able to move all 4 extremities.  Skin: Warm and dry, no rashes noted.  Respiratory: Not using accessory muscles, speaking in full sentences, trachea midline.  Cardiovascular: Pulses palpable, no extremity edema. Abdomen: Does not appear distended. Back Exam:  Inspection: Unremarkable  Motion: Flexion 45 deg, Extension 45 deg, Side Bending to 45 deg bilaterally,  Rotation to 45 deg bilaterally  SLR laying: Negative  XSLR laying: Negative  Palpable tenderness: None. FABER: negative. Sensory change: Gross sensation intact to all lumbar and sacral dermatomes.  Reflexes: 2+ at both patellar tendons, 2+ at achilles tendons, Babinski's downgoing.  Strength at foot  Plantar-flexion: 5/5 Dorsi-flexion: 5/5 Eversion: 5/5 Inversion: 5/5  Leg strength  Quad: 5/5 Hamstring: 5/5 Hip  flexor: 5/5 Hip abductors: 5/5  Gait unremarkable.  Impression and Recommendations:   This case required medical decision making of moderate complexity.

## 2012-04-05 ENCOUNTER — Other Ambulatory Visit: Payer: Self-pay | Admitting: Family Medicine

## 2012-04-24 DIAGNOSIS — K259 Gastric ulcer, unspecified as acute or chronic, without hemorrhage or perforation: Secondary | ICD-10-CM

## 2012-04-24 HISTORY — DX: Gastric ulcer, unspecified as acute or chronic, without hemorrhage or perforation: K25.9

## 2012-05-12 ENCOUNTER — Ambulatory Visit: Payer: Medicare Other | Admitting: Family Medicine

## 2012-05-14 ENCOUNTER — Encounter: Payer: Self-pay | Admitting: Family Medicine

## 2012-05-14 DIAGNOSIS — K259 Gastric ulcer, unspecified as acute or chronic, without hemorrhage or perforation: Secondary | ICD-10-CM | POA: Insufficient documentation

## 2012-05-17 ENCOUNTER — Encounter: Payer: Self-pay | Admitting: Family Medicine

## 2012-05-17 ENCOUNTER — Other Ambulatory Visit: Payer: Self-pay | Admitting: Family Medicine

## 2012-05-17 ENCOUNTER — Telehealth: Payer: Self-pay

## 2012-05-17 DIAGNOSIS — I739 Peripheral vascular disease, unspecified: Secondary | ICD-10-CM | POA: Insufficient documentation

## 2012-05-17 MED ORDER — METOPROLOL TARTRATE 50 MG PO TABS: 50.0000 mg | ORAL_TABLET | Freq: Two times a day (BID) | ORAL | Status: DC

## 2012-05-17 NOTE — Telephone Encounter (Signed)
Since acute reanl failure will stop the Ziac. Will send over rx for metoprolol.

## 2012-05-17 NOTE — Telephone Encounter (Signed)
Stacey Grimes states they did completely take her off the Ziac and did not replace it with another. She took her blood pressure at the pharmacy and it was 185/90. The pharmacist gave her 6 Ziac pills to last until her appointment this week.

## 2012-05-17 NOTE — Telephone Encounter (Signed)
Today not replace her blood pressure per was something else? Is also tried a call and get records from the hospital. I really don't think it's anything on her. It was approximately that they would take her off her blood pressure medication completely and not replace it with something else.

## 2012-05-17 NOTE — Telephone Encounter (Signed)
Awaiting fax from hospital.

## 2012-05-17 NOTE — Telephone Encounter (Signed)
Stacey Grimes was in the hospital for 3 days due to bilateral leg pain. [facility] hospital. She was taken off Ziac because they believed this was causing her leg pain. She has a hospital follow up with Jade on the 26 th. Now she is having headaches. She wants something for the headaches and states she has a history of Migraines. She has not been able to check her blood pressure. I did advise her to have her blood pressure checked today.

## 2012-05-18 NOTE — Telephone Encounter (Signed)
Pt notified of med change.  She wants to know if you were going to send her in something for pain? Please advise

## 2012-05-18 NOTE — Telephone Encounter (Signed)
Can address during OV. We haven't seen or evaluated her for the pain. Not really sure what she is talking about.

## 2012-05-19 ENCOUNTER — Ambulatory Visit (INDEPENDENT_AMBULATORY_CARE_PROVIDER_SITE_OTHER): Payer: Medicare Other | Admitting: Physician Assistant

## 2012-05-19 ENCOUNTER — Encounter: Payer: Self-pay | Admitting: Physician Assistant

## 2012-05-19 VITALS — BP 127/77 | HR 76 | Wt 88.0 lb

## 2012-05-19 DIAGNOSIS — A048 Other specified bacterial intestinal infections: Secondary | ICD-10-CM

## 2012-05-19 DIAGNOSIS — K259 Gastric ulcer, unspecified as acute or chronic, without hemorrhage or perforation: Secondary | ICD-10-CM

## 2012-05-19 DIAGNOSIS — I10 Essential (primary) hypertension: Secondary | ICD-10-CM

## 2012-05-19 DIAGNOSIS — R519 Headache, unspecified: Secondary | ICD-10-CM

## 2012-05-19 DIAGNOSIS — B9681 Helicobacter pylori [H. pylori] as the cause of diseases classified elsewhere: Secondary | ICD-10-CM

## 2012-05-19 DIAGNOSIS — R51 Headache: Secondary | ICD-10-CM

## 2012-05-19 DIAGNOSIS — N179 Acute kidney failure, unspecified: Secondary | ICD-10-CM

## 2012-05-19 MED ORDER — HYDROCODONE-ACETAMINOPHEN 5-325 MG PO TABS: 1.0000 | ORAL_TABLET | Freq: Four times a day (QID) | ORAL | Status: DC | PRN

## 2012-05-19 NOTE — Patient Instructions (Addendum)
Follow up with Dr. Linford Arnold in 1 week to recheck blood pressure again. Do not start Metoprolol BP still looking great.   Will recheck kidney function today. Use hydrocodone for headache relief. If kidney function normal could consider steriod injection to help with headaches.AS infection decreases in body headache might also improve.

## 2012-05-19 NOTE — Progress Notes (Signed)
  Subjective:    Patient ID: Stacey Grimes, female    DOB: 1941-02-12, 72 y.o.   MRN: [id number]  HPI Patient presents to the clinic for a hospital follow up. She was admitted to the hospital on 05/09/11 discharged from the hospital on 05/12/12. For acute renal failure and gastric ulcer due to h.pylori. She has not started treatment for h.pylori because she wanted to check with our office first. She is still having significant pain.  Her BP was low in the hospital as well as in renal failure so they took her off her Ziac. She has not been on any blood pressure medication since. Her BP's are good today. She took BP once and BP was over 180/90. Dr. Linford Arnold sent metroprolol to pharmacy but she never took medication. Denies any CP, palpitations or SOB. She has had headache over the right side of her head and behind eye.It is constant. She denies any vision changes. 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 She denies any right side weakness or speech slurrness. Pain is 7/10 on a pain scale. She can not take ibuprofen which usually helps.   Review of Systems     Objective:   Physical Exam  Constitutional: She is oriented to person, place, and time. She appears well-developed and well-nourished.  HENT:  Head: Normocephalic and atraumatic.  Eyes: Conjunctivae and EOM are normal. Pupils are equal, round, and reactive to light.  Neck: Normal range of motion. Neck supple. No thyromegaly present.  Cardiovascular: Normal rate, regular rhythm and normal heart sounds.   Pulmonary/Chest: Effort normal and breath sounds normal. She has no wheezes.  Neurological: She is alert and oriented to person, place, and time. She has normal reflexes. No cranial nerve deficit.  Skin: Skin is warm and dry.  Psychiatric: She has a normal mood and affect. Her behavior is normal.          Assessment & Plan:  Acute renal failure- Will recheck BMP today. Continue to drink  water.  HTN- Stay off all BP medications. Let's recheck BP in 1 week.   H.pylori/gastric ulcer- Start treatment with antibiotic today. Stay away from NSAIDS.   Right sided headache- Cannot give NSAIDS, STeroids might be too much for kidneys. Gave Vicodin to use for headache. Follow up in 1 week with Dr. Linford Arnold if not improving. Call if any right sided weakness or vision changes. If headache continues could consider Imaging of head.

## 2012-05-20 LAB — BASIC METABOLIC PANEL
BUN: 18 mg/dL (ref 6–23)
Creat: 1.24 mg/dL — ABNORMAL HIGH (ref 0.50–1.10)
Glucose, Bld: 114 mg/dL — ABNORMAL HIGH (ref 70–99)

## 2012-05-26 ENCOUNTER — Ambulatory Visit (INDEPENDENT_AMBULATORY_CARE_PROVIDER_SITE_OTHER): Payer: Medicare Other | Admitting: Family Medicine

## 2012-05-26 ENCOUNTER — Encounter: Payer: Self-pay | Admitting: Family Medicine

## 2012-05-26 VITALS — BP 174/94 | HR 95 | Wt 89.0 lb

## 2012-05-26 DIAGNOSIS — H5711 Ocular pain, right eye: Secondary | ICD-10-CM

## 2012-05-26 DIAGNOSIS — R51 Headache: Secondary | ICD-10-CM

## 2012-05-26 DIAGNOSIS — M79609 Pain in unspecified limb: Secondary | ICD-10-CM

## 2012-05-26 DIAGNOSIS — H5710 Ocular pain, unspecified eye: Secondary | ICD-10-CM

## 2012-05-26 DIAGNOSIS — K259 Gastric ulcer, unspecified as acute or chronic, without hemorrhage or perforation: Secondary | ICD-10-CM

## 2012-05-26 DIAGNOSIS — I10 Essential (primary) hypertension: Secondary | ICD-10-CM

## 2012-05-26 DIAGNOSIS — N289 Disorder of kidney and ureter, unspecified: Secondary | ICD-10-CM

## 2012-05-26 DIAGNOSIS — M79604 Pain in right leg: Secondary | ICD-10-CM

## 2012-05-26 MED ORDER — PREDNISONE 20 MG PO TABS: ORAL_TABLET | ORAL | Status: DC

## 2012-05-26 MED ORDER — KETOROLAC TROMETHAMINE 60 MG/2ML IM SOLN
60.0000 mg | Freq: Once | INTRAMUSCULAR | Status: AC
  Administered 2012-05-26: 60 mg via INTRAMUSCULAR

## 2012-05-26 MED ORDER — PROMETHAZINE HCL 25 MG/ML IJ SOLN
25.0000 mg | Freq: Once | INTRAMUSCULAR | Status: AC
  Administered 2012-05-26: 25 mg via INTRAMUSCULAR

## 2012-05-26 NOTE — Progress Notes (Signed)
Subjective:    Patient ID: Stacey Grimes, female    DOB: [birth year], 72 y.o.   MRN: [id number]  HPI Has had a HA since has been in the hospital.  She was hoping for a shot last week and is tearful that she didn't get it. Says she vomited up her amox and her pain mediation for the HA.  Wants to see an eye doctor here in [place]. HA is on her bliat, but worse on the right side and behind her right eye.  Rates her headache pain is 6/10 today.  She felt they stopped her BP med bc of calcium build up in her leg vessel.  Smoker, HTN, hyperlipidemai.  High risk for PVD.    Still having a lot of pain and numbness in her right upper thigh and feels a sensation of water going down her legs. It hurts with rest or activity. She was diagnosed with peripheral vascular disease at the hospital. Has vascular appt on March 19th.  Says the prednisone Dr Karie Schwalbe. Jovita Gamma her helped her pain but says the vidodin wasn't helpful.  Says wan't sure it really helped.   Gastric ulcers-she is taking her pantoprazole 40 mg but she brought the bottle in with her today. She says her abdominal pain is significantly better. The nausea has resolved. She's eating a little bit more normally.  Review of Systems     Objective:   Physical Exam  Constitutional: She is oriented to person, place, and time. She appears well-developed and well-nourished.  HENT:  Head: Normocephalic and atraumatic.  Cardiovascular: Normal rate, regular rhythm and normal heart sounds.   Pulmonary/Chest: Effort normal and breath sounds normal.  Abdominal: Soft. Bowel sounds are normal. She exhibits no distension and no mass. There is no tenderness. There is no rebound and no guarding.  Neurological: She is alert and oriented to person, place, and time.  Skin: Skin is warm and dry.  Psychiatric: She has a normal mood and affect. Her behavior is normal.          Assessment & Plan:  HTN - blood pressure looks great last week when she was off of her  medication. She reports that looked good during her hospitalization as well. I explained to her the BP med didn't cause the calcium build-up in her legs. Explained that that is a separate issue peripheral vascular disease..  Will start metoprolol 50mg , 1/2 tab BID.  If she tolerates this well after couple days then increase to whole tab twice a day. Followup in one month to recheck blood pressure.  HA - Will given toradol and phenergan injection for acute pain relief. Asked her to go home and rest today. She did not drive herself. Refer her to ophthalmology for further evaluation of her eye pain.    Acute renal insufficency - Will recheck kidney function in 2 weeks. Given lab slip today to take in 2 weeks.  Right eye pain  - her ophthalmologist is at [facility]. She requested someone more locally. She says difficult to tell with parking and then usually has to wait 3-4 hours to be seen. Will refer to ophtho here and [facility], Dr. Hardie Shackleton.   Right leg pain-unclear if this is from her peripheral vascular disease or a musculoskeletal disorder. She felt like the prednisone was the only thing that brought her pain relief. Thus I gave her a new prescription today for 40 mg daily for 5 days and then 20 mg daily for  5 days. That should hopefully be enough to get her some relief until she sees the vascular surgeon. She does have some right lumbar radiculitis as well but certainly could be contributing to her pain and may be why the steroid was helpful. She says she's not convinced that the back pain was really from her spine and that could have been from her gastric ulcers.  Gastric ulcers-her pain is significantly improved. She's eating more normally. She is also cut back to 3 cigarettes per day.  Tobacco abuse-cut down to 3 cigarettes per daily which is fantastic for her. Gave her encouragement and her eye was very proud of her for making this change. She says she plans to have  quit in about a month.

## 2012-05-26 NOTE — Patient Instructions (Signed)
Please go home and rest at your shot today. -Never prescription for prednisone for your right leg pain. Says this was helpful before hopefully this will help again and recently again with the vascular surgeon. We will be calling you with a referral for your eye appointment. Hopefully they can get him in within the next couple of weeks. I would like to go back to the lab in about 2 weeks to recheck your kidney function and make sure that it's holding steady.

## 2012-06-01 ENCOUNTER — Telehealth: Payer: Self-pay | Admitting: *Deleted

## 2012-06-01 MED ORDER — HYDROCODONE-ACETAMINOPHEN 5-325 MG PO TABS: 1.0000 | ORAL_TABLET | Freq: Four times a day (QID) | ORAL | Status: DC | PRN

## 2012-06-01 NOTE — Telephone Encounter (Signed)
Patient calls and states she thought you were gonna send in something for her leg pain at last office visit but has checked pharmacy 2 times and nothing is there. I looked in note and I see where you gave her steroids but nothing for pain. Please advise. Pt wanting something called in

## 2012-06-01 NOTE — Telephone Encounter (Signed)
Ok will send over HYDROCODONE.

## 2012-06-01 NOTE — Telephone Encounter (Signed)
Pt notified and faxed to pharmacy

## 2012-06-11 ENCOUNTER — Telehealth: Payer: Self-pay

## 2012-06-11 MED ORDER — LISINOPRIL 10 MG PO TABS: 10.0000 mg | ORAL_TABLET | Freq: Every day | ORAL | Status: DC

## 2012-06-11 NOTE — Telephone Encounter (Signed)
Stacey Grimes states since she started taking Metoprolol she has had swelling in both feet. Left foot is worse than right. Please advise.

## 2012-06-11 NOTE — Telephone Encounter (Signed)
Patient advised.

## 2012-06-11 NOTE — Telephone Encounter (Signed)
Ok will change to lisinopril. Stop metoprolol

## 2012-06-16 ENCOUNTER — Ambulatory Visit: Payer: Medicare Other | Admitting: Family Medicine

## 2012-06-16 ENCOUNTER — Encounter: Payer: Self-pay | Admitting: Physician Assistant

## 2012-06-16 ENCOUNTER — Ambulatory Visit (INDEPENDENT_AMBULATORY_CARE_PROVIDER_SITE_OTHER): Payer: Medicare Other | Admitting: Physician Assistant

## 2012-06-16 VITALS — BP 167/91 | HR 101 | Wt 90.0 lb

## 2012-06-16 DIAGNOSIS — I10 Essential (primary) hypertension: Secondary | ICD-10-CM

## 2012-06-16 DIAGNOSIS — I6529 Occlusion and stenosis of unspecified carotid artery: Secondary | ICD-10-CM

## 2012-06-16 DIAGNOSIS — M79605 Pain in left leg: Secondary | ICD-10-CM

## 2012-06-16 DIAGNOSIS — N289 Disorder of kidney and ureter, unspecified: Secondary | ICD-10-CM

## 2012-06-16 DIAGNOSIS — M79609 Pain in unspecified limb: Secondary | ICD-10-CM

## 2012-06-16 DIAGNOSIS — F17200 Nicotine dependence, unspecified, uncomplicated: Secondary | ICD-10-CM

## 2012-06-16 DIAGNOSIS — L299 Pruritus, unspecified: Secondary | ICD-10-CM

## 2012-06-16 DIAGNOSIS — E785 Hyperlipidemia, unspecified: Secondary | ICD-10-CM

## 2012-06-16 DIAGNOSIS — Z720 Tobacco use: Secondary | ICD-10-CM

## 2012-06-16 DIAGNOSIS — R739 Hyperglycemia, unspecified: Secondary | ICD-10-CM

## 2012-06-16 DIAGNOSIS — R748 Abnormal levels of other serum enzymes: Secondary | ICD-10-CM

## 2012-06-16 DIAGNOSIS — R7309 Other abnormal glucose: Secondary | ICD-10-CM

## 2012-06-16 DIAGNOSIS — I739 Peripheral vascular disease, unspecified: Secondary | ICD-10-CM

## 2012-06-16 LAB — CBC
HCT: 40.5 % (ref 36.0–46.0)
Hemoglobin: 13.7 g/dL (ref 12.0–15.0)
MCHC: 33.8 g/dL (ref 30.0–36.0)
MCV: 92.7 fL (ref 78.0–100.0)
RDW: 14.9 % (ref 11.5–15.5)

## 2012-06-16 LAB — BASIC METABOLIC PANEL WITH GFR
BUN: 15 mg/dL (ref 6–23)
CO2: 25 mEq/L (ref 19–32)
Chloride: 104 mEq/L (ref 96–112)
Creat: 1.22 mg/dL — ABNORMAL HIGH (ref 0.50–1.10)
Glucose, Bld: 106 mg/dL — ABNORMAL HIGH (ref 70–99)

## 2012-06-16 LAB — HEPATIC FUNCTION PANEL
ALT: 8 U/L (ref 0–35)
AST: 17 U/L (ref 0–37)
Total Protein: 6.4 g/dL (ref 6.0–8.3)

## 2012-06-16 MED ORDER — ROSUVASTATIN CALCIUM 20 MG PO TABS: 20.0000 mg | ORAL_TABLET | Freq: Every day | ORAL | Status: DC

## 2012-06-16 MED ORDER — HYDROCODONE-ACETAMINOPHEN 5-325 MG PO TABS: 1.0000 | ORAL_TABLET | Freq: Four times a day (QID) | ORAL | Status: DC | PRN

## 2012-06-16 NOTE — Patient Instructions (Addendum)
Elevated feet as much as possible. Increase Lisinopril to 30mg  or 3 tabs. daily and recheck with Dr. Linford Arnold in 2 weeks.  Encouraged pt to go back on cholesterol medication.      Peripheral Vascular Disease Peripheral Vascular Disease (PVD), also called Peripheral Arterial Disease (PAD), is a circulation problem caused by cholesterol (atherosclerotic plaque) deposits in the arteries. PVD commonly occurs in the lower extremities (legs) but it can occur in other areas of the body, such as your arms. The cholesterol buildup in the arteries reduces blood flow which can cause pain and other serious problems. The presence of PVD can place a person at risk for Coronary Artery Disease (CAD).  CAUSES  Causes of PVD can be many. It is usually associated with more than one risk factor such as:   High Cholesterol.  Smoking.  Diabetes.  Lack of exercise or inactivity.  High blood pressure (hypertension).  Obesity.  Family history. SYMPTOMS   When the lower extremities are affected, patients with PVD may experience:  Leg pain with exertion or physical activity. This is called INTERMITTENT CLAUDICATION. This may present as cramping or numbness with physical activity. The location of the pain is associated with the level of blockage. For example, blockage at the abdominal level (distal abdominal aorta) may result in buttock or hip pain. Lower leg arterial blockage may result in calf pain.  As PVD becomes more severe, pain can develop with less physical activity.  In people with severe PVD, leg pain may occur at rest.  Other PVD signs and symptoms:  Leg numbness or weakness.  Coldness in the affected leg or foot, especially when compared to the other leg.  A change in leg color.  Patients with significant PVD are more prone to ulcers or sores on toes, feet or legs. These may take longer to heal or may reoccur. The ulcers or sores can become infected.  If signs and symptoms of PVD are  ignored, gangrene may occur. This can result in the loss of toes or loss of an entire limb.  Not all leg pain is related to PVD. Other medical conditions can cause leg pain such as:  Blood clots (embolism) or Deep Vein Thrombosis.  Inflammation of the blood vessels (vasculitis).  Spinal stenosis. DIAGNOSIS  Diagnosis of PVD can involve several different types of tests. These can include:  Pulse Volume Recording Method (PVR). This test is simple, painless and does not involve the use of X-rays. PVR involves measuring and comparing the blood pressure in the arms and legs. An ABI (Ankle-Brachial Index) is calculated. The normal ratio of blood pressures is 1. As this number becomes smaller, it indicates more severe disease.  < 0.95  indicates significant narrowing in one or more leg vessels.  <0.8 there will usually be pain in the foot, leg or buttock with exercise.  <0.4 will usually have pain in the legs at rest.  <0.25  usually indicates limb threatening PVD.  Doppler detection of pulses in the legs. This test is painless and checks to see if you have a pulses in your legs/feet.  A dye or contrast material (a substance that highlights the blood vessels so they show up on x-ray) may be given to help your caregiver better see the arteries for the following tests. The dye is eliminated from your body by the kidney's. Your caregiver may order blood work to check your kidney function and other laboratory values before the following tests are performed:  Magnetic Resonance Angiography (  MRA). An MRA is a picture study of the blood vessels and arteries. The MRA machine uses a large magnet to produce images of the blood vessels.  Computed Tomography Angiography (CTA). A CTA is a specialized x-ray that looks at how the blood flows in your blood vessels. An IV may be inserted into your arm so contrast dye can be injected.  Angiogram. Is a procedure that uses x-rays to look at your blood vessels.  This procedure is minimally invasive, meaning a small incision (cut) is made in your groin. A small tube (catheter) is then inserted into the artery of your groin. The catheter is guided to the blood vessel or artery your caregiver wants to examine. Contrast dye is injected into the catheter. X-rays are then taken of the blood vessel or artery. After the images are obtained, the catheter is taken out. TREATMENT  Treatment of PVD involves many interventions which may include:  Lifestyle changes:  Quitting smoking.  Exercise.  Following a low fat, low cholesterol diet.  Control of diabetes.  Foot care is very important to the PVD patient. Good foot care can help prevent infection.  Medication:  Cholesterol-lowering medicine.  Blood pressure medicine.  Anti-platelet drugs.  Certain medicines may reduce symptoms of Intermittent Claudication.  Interventional/Surgical options:  Angioplasty. An Angioplasty is a procedure that inflates a balloon in the blocked artery. This opens the blocked artery to improve blood flow.  Stent Implant. A wire mesh tube (stent) is placed in the artery. The stent expands and stays in place, allowing the artery to remain open.  Peripheral Bypass Surgery. This is a surgical procedure that reroutes the blood around a blocked artery to help improve blood flow. This type of procedure may be performed if Angioplasty or stent implants are not an option. SEEK IMMEDIATE MEDICAL CARE IF:   You develop pain or numbness in your arms or legs.  Your arm or leg turns cold, becomes blue in color.  You develop redness, warmth, swelling and pain in your arms or legs. MAKE SURE YOU:   Understand these instructions.  Will watch your condition.  Will get help right away if you are not doing well or get worse. Document Released: 04/17/2004 Document Revised: 06/02/2011 Document Reviewed: 03/14/2008 [facility] Patient Information 2013 [place], [place].

## 2012-06-16 NOTE — Progress Notes (Addendum)
Subjective:    Patient ID: Stacey Grimes, female    DOB: February 01, 1941, 72 y.o.   MRN: [id number]  HPI Patient presents to the clinic to follow up with a few concerns.   Pt is still having problems with blood pressure. Denies any CP, palpitations, numbness or tingling. She did have a headache but it is much better. Taking lisinopril daily. Not able to tolerate BB.   Hyperlipidemia- not currently taking crestor. Needs to go back on it.  Acute renal failure- off all meds that could make worse. Needs recheck.   PVD- ABI's were abnormal. Vascular is going to schedule a procedure and possible stents in the next couple of weeks. Having signifcant bilateral leg pain.  Bilateral leg swelling- swelling in feet when walks around for long peroids of time or sits for longer periods of time. Does not do anything to make better. Legs are very scaly and itchy.   Elevated fasting glucose- will check A!C today. Denies any frequent urination. She has felt nauseated lately. Feels like her vision is a little blurry and bilateral leg pain worse.     Pt has carotid stenosis and would like to have dopplers done to check for any bruits. Denis any ongoing problems. She continues to smoke a few cigarrettes a day but has decreased significantly. She seems to be smoking around 3 a day.    Review of Systems     Objective:   Physical Exam  Constitutional: She is oriented to person, place, and time. She appears well-developed and well-nourished.  HENT:  Head: Normocephalic and atraumatic.  Eyes: Conjunctivae are normal.  Neck: Normal range of motion. Neck supple.  No bruits bilaterally.   Cardiovascular: Regular rhythm and normal heart sounds.   Tachycardia at 101.   Pulmonary/Chest: Effort normal and breath sounds normal. She has no wheezes.  Neurological: She is alert and oriented to person, place, and time.  Skin:  Bilateral reddish colored feet. No palpable pulse. Mild general edema surround entire feet.    Psychiatric: She has a normal mood and affect. Her behavior is normal.          Assessment & Plan:  HTN- BP is still uncontrolled. Pt not able to tolerate BB. Will increase lisinopril to 30mg  or 3 tabs daily. Recheck in 2 weeks. Considered starting CCB but pt was very resistent to start another medication.   PVD- Pt is scheduled for additional testing and possible stent placement in next couple of weeks. Reassured pt that likely the bilateral leg pain is coming from PVD and stents will improve significantly. Pt was given mobic. I still do not want patient on a NSAID since so close after ulcer. REfilled Norco for pain control. Discussed with patient that the best thing for PVD is treating all the surrounding symptoms and causes.  Hyperlipidemia- Refilled Crestor.   Bilateral leg swelling/Itching- Discussed likely from PVD also. Encouraged elevation. Discourage ace wraps/compression stocking until after stents. Gave handout and other symptomatic care. Do not want to give diruretic at this point since acute renal failure recently. Itching could come from swelling of skin on calf. Encouraged lotion. Will get CBc just to make sure RBC/HgB not elevated along with hepatic function test. Educated pt that we have that mobic caused itching as a side effect and she brought in mobic. Told her to stop and see if that helped with itching.   Acute renal failure- will get BMP to check renal function and see if normalized.   Elevated  liver enzymes- will recheck today.Pt is taking tylenol/norco on a regular basis over the past couple of months due to pain.   Elevated fasting glucose-A!C is 5.5. Dicussed looks fine. NO t even in pre diabetic range. Continue to have balanced diet and watch carbs.    Carotid artery stenosis- Will schedule bilateral carotid dopplers. Pt was on ASA daily but after gastric ulcer went off.   Tobacco abuse- Encouraged pt on the importance of quiting to help all of problems and  diseases.   Spent 30 minutes and greater than 50 percent of visit spent counseling about bilateral leg pain.

## 2012-06-18 ENCOUNTER — Telehealth: Payer: Self-pay | Admitting: *Deleted

## 2012-06-18 ENCOUNTER — Encounter: Payer: Self-pay | Admitting: Family Medicine

## 2012-06-18 NOTE — Telephone Encounter (Signed)
Stacey Grimes would like to have ABI scan done at [facility] in [place]. Dr. Caleen Essex nurse is aware.

## 2012-06-28 ENCOUNTER — Encounter: Payer: Self-pay | Admitting: Family Medicine

## 2012-06-28 ENCOUNTER — Ambulatory Visit (INDEPENDENT_AMBULATORY_CARE_PROVIDER_SITE_OTHER): Payer: Medicare Other | Admitting: Family Medicine

## 2012-06-28 VITALS — BP 141/82 | HR 106 | Wt 89.0 lb

## 2012-06-28 DIAGNOSIS — I739 Peripheral vascular disease, unspecified: Secondary | ICD-10-CM

## 2012-06-28 DIAGNOSIS — I10 Essential (primary) hypertension: Secondary | ICD-10-CM

## 2012-06-28 DIAGNOSIS — K219 Gastro-esophageal reflux disease without esophagitis: Secondary | ICD-10-CM

## 2012-06-28 MED ORDER — LISINOPRIL 10 MG PO TABS: 10.0000 mg | ORAL_TABLET | Freq: Every day | ORAL | Status: DC

## 2012-06-28 MED ORDER — HYDROCODONE-ACETAMINOPHEN 5-325 MG PO TABS: 1.0000 | ORAL_TABLET | Freq: Four times a day (QID) | ORAL | Status: DC | PRN

## 2012-06-28 NOTE — Progress Notes (Signed)
  Subjective:    Patient ID: Stacey Grimes, female    DOB: 1940-09-29, 72 y.o.   MRN: [id number]  HPI PVD - Has appt on Thursday for stent placement in her legs.  Wants refil lon the hydrocodone.  Has been taking it every 4 hours.  Says it really hurts.  Says when was going through PT her little toe turned purple and that initiated a owrk up. She is down to 2 cig per day.    HTN-  Pt denies chest pain, SOB, dizziness, or heart palpitations.  Taking meds as directed w/o problems.  Denies medication side effects. Marland Kitchen  GERD - Stomach is 100% better back on the protonix.   Review of Systems     Objective:   Physical Exam  Constitutional: She is oriented to person, place, and time. She appears well-developed and well-nourished.  HENT:  Head: Normocephalic and atraumatic.  Cardiovascular: Normal rate, regular rhythm and normal heart sounds.   No carotid bruits.   Pulmonary/Chest: Effort normal and breath sounds normal.  Neurological: She is alert and oriented to person, place, and time.  Skin: Skin is warm and dry.  Psychiatric: She has a normal mood and affect. Her behavior is normal.          Assessment & Plan:  PVD - She is working on quitting smoking.  She is down to 2 cigarettes per day and doing great with this. Encouraged her to continue to work on complete cessation. Also after she has her stent placed on Wednesday I want her to go ahead and start a baby aspirin daily. She is already on a statin.  HTN - Well controlled. F/U in 3-4 months.    GERD- Sxs under control. Continue Protonix for now.

## 2012-06-28 NOTE — Patient Instructions (Addendum)
Please start Aspirin 81mg  coated daily after your stents are placed

## 2012-06-29 ENCOUNTER — Other Ambulatory Visit: Payer: Self-pay | Admitting: *Deleted

## 2012-12-22 ENCOUNTER — Other Ambulatory Visit: Payer: Self-pay | Admitting: Family Medicine

## 2013-03-22 ENCOUNTER — Telehealth: Payer: Self-pay | Admitting: *Deleted

## 2013-03-22 ENCOUNTER — Other Ambulatory Visit: Payer: Self-pay | Admitting: Family Medicine

## 2013-03-22 NOTE — Telephone Encounter (Signed)
LMOM for pt that we are only sending 2 weeks worth of Lisinopril to pharmacy due to being passed due for f/u with provider.  Meyer Cory, LPN

## 2013-04-06 ENCOUNTER — Ambulatory Visit (INDEPENDENT_AMBULATORY_CARE_PROVIDER_SITE_OTHER): Payer: Medicare Other | Admitting: Family Medicine

## 2013-04-06 ENCOUNTER — Encounter: Payer: Self-pay | Admitting: Family Medicine

## 2013-04-06 VITALS — BP 132/71 | HR 68 | Temp 98.0°F | Wt 104.0 lb

## 2013-04-06 DIAGNOSIS — I739 Peripheral vascular disease, unspecified: Secondary | ICD-10-CM

## 2013-04-06 DIAGNOSIS — E785 Hyperlipidemia, unspecified: Secondary | ICD-10-CM

## 2013-04-06 DIAGNOSIS — I10 Essential (primary) hypertension: Secondary | ICD-10-CM

## 2013-04-06 MED ORDER — PANTOPRAZOLE SODIUM 40 MG PO TBEC: 40.0000 mg | DELAYED_RELEASE_TABLET | Freq: Every day | ORAL | Status: DC

## 2013-04-06 MED ORDER — LISINOPRIL 10 MG PO TABS: 10.0000 mg | ORAL_TABLET | Freq: Every day | ORAL | Status: DC

## 2013-04-06 MED ORDER — ROSUVASTATIN CALCIUM 20 MG PO TABS: 20.0000 mg | ORAL_TABLET | Freq: Every day | ORAL | Status: DC

## 2013-04-06 NOTE — Progress Notes (Signed)
   Subjective:    Patient ID: Stacey Grimes, female    DOB: 1940/07/31, 73 y.o.   MRN: [id number]  HPI  Hypertension- Pt denies chest pain, SOB, dizziness, or heart palpitations.  Taking meds as directed w/o problems.  Denies medication side effects.    She is feeling better and has gaine dsome weight back. She does have known peripheral vascular disease.  Hyperlipidemia - tolerating statin well without any side effects or myalgias. Lab Results  Component Value Date   CHOL 172 12/26/2010   HDL 41 12/26/2010   LDLCALC 85 12/26/2010   TRIG 230* 12/26/2010   CHOLHDL 4.2 12/26/2010     Review of Systems     Objective:   Physical Exam  Constitutional: She is oriented to person, place, and time. She appears well-developed and well-nourished.  HENT:  Head: Normocephalic and atraumatic.  Cardiovascular: Normal rate, regular rhythm and normal heart sounds.   2/6 SEM murmur  Pulmonary/Chest: Effort normal and breath sounds normal.  Neurological: She is alert and oriented to person, place, and time.  Skin: Skin is warm and dry.  Psychiatric: She has a normal mood and affect. Her behavior is normal.          Assessment & Plan:  HTN- well controlled. Due for CMP and lipids. Refill sent to pharmacy today. Followup in 6 months.  Hyperlipidemia - due to recheck lipids. She says she woke up later this week when she is fasting. Continue statin for now.  Peripheral vascular disease-she's currently taking a statin.

## 2013-04-07 ENCOUNTER — Other Ambulatory Visit: Payer: Self-pay | Admitting: *Deleted

## 2013-04-07 MED ORDER — ROSUVASTATIN CALCIUM 20 MG PO TABS: 20.0000 mg | ORAL_TABLET | Freq: Every day | ORAL | Status: DC

## 2013-04-14 LAB — COMPLETE METABOLIC PANEL WITH GFR
ALK PHOS: 102 U/L (ref 39–117)
ALT: 11 U/L (ref 0–35)
AST: 19 U/L (ref 0–37)
Albumin: 3.7 g/dL (ref 3.5–5.2)
BILIRUBIN TOTAL: 0.3 mg/dL (ref 0.3–1.2)
BUN: 20 mg/dL (ref 6–23)
CO2: 27 mEq/L (ref 19–32)
CREATININE: 1.47 mg/dL — AB (ref 0.50–1.10)
Calcium: 9.4 mg/dL (ref 8.4–10.5)
Chloride: 103 mEq/L (ref 96–112)
GFR, EST AFRICAN AMERICAN: 41 mL/min — AB
GFR, EST NON AFRICAN AMERICAN: 36 mL/min — AB
GLUCOSE: 90 mg/dL (ref 70–99)
Potassium: 6 mEq/L — ABNORMAL HIGH (ref 3.5–5.3)
SODIUM: 137 meq/L (ref 135–145)
TOTAL PROTEIN: 6.3 g/dL (ref 6.0–8.3)

## 2013-04-14 LAB — LIPID PANEL
CHOL/HDL RATIO: 2.9 ratio
Cholesterol: 137 mg/dL (ref 0–200)
HDL: 47 mg/dL (ref >39)
LDL Cholesterol: 54 mg/dL (ref 0–99)
Triglycerides: 180 mg/dL — ABNORMAL HIGH (ref <150)
VLDL: 36 mg/dL (ref 0–40)

## 2013-04-18 ENCOUNTER — Other Ambulatory Visit: Payer: Self-pay | Admitting: *Deleted

## 2013-04-18 DIAGNOSIS — E875 Hyperkalemia: Secondary | ICD-10-CM

## 2013-04-28 ENCOUNTER — Telehealth: Payer: Self-pay | Admitting: *Deleted

## 2013-04-28 NOTE — Telephone Encounter (Signed)
Pt called and stated that she hasn't been able to get her bp taken due to the machines in the pharmacies that she has gone to have been out of order. She is concerned that her bp is elevated and feels that she should be on something. I reassured her that she will be ok until she has her bloodwork done and Dr. Madilyn Fireman gets her results back to see whether or not she will/will not change her meds. Pt reports that although she does not take a potassium supplement she does eat a banana every morning of which she did not mention when I gave her results last week I told her to STOP eating the bananas. She also c/o having HA I told her that she can either use a cold compress across her forehead or take some tylenol. Pt stated that she has hx of migraines and usually takes Excedrin migraine and this helps her. Pt was very thankful for the time that I took speaking with her about this and voiced understanding and agreed to suggestions.Stacey Grimes

## 2013-04-29 ENCOUNTER — Telehealth: Payer: Self-pay | Admitting: *Deleted

## 2013-04-29 DIAGNOSIS — R7989 Other specified abnormal findings of blood chemistry: Secondary | ICD-10-CM

## 2013-04-29 DIAGNOSIS — R945 Abnormal results of liver function studies: Secondary | ICD-10-CM

## 2013-04-29 LAB — BASIC METABOLIC PANEL
BUN: 18 mg/dL (ref 6–23)
CO2: 27 mEq/L (ref 19–32)
Calcium: 9.6 mg/dL (ref 8.4–10.5)
Chloride: 101 mEq/L (ref 96–112)
Creat: 1.55 mg/dL — ABNORMAL HIGH (ref 0.50–1.10)
GLUCOSE: 101 mg/dL — AB (ref 70–99)
POTASSIUM: 4.3 meq/L (ref 3.5–5.3)
SODIUM: 139 meq/L (ref 135–145)

## 2013-04-29 NOTE — Telephone Encounter (Signed)
Pt called and stated that she had a really bad migraine last night and she had her bp checked this morning and it was 199/103. She is not currently taking any medication. Please advise .

## 2013-04-29 NOTE — Telephone Encounter (Signed)
She needs to be seen in urgent care with a blood pressure that high.

## 2013-04-29 NOTE — Telephone Encounter (Signed)
She needs to be seen in urgent care with a blood pressure that high, drink a good amount of water and avoid all salt until then.

## 2013-05-02 ENCOUNTER — Other Ambulatory Visit: Payer: Self-pay | Admitting: *Deleted

## 2013-05-02 DIAGNOSIS — R7989 Other specified abnormal findings of blood chemistry: Secondary | ICD-10-CM

## 2013-05-02 NOTE — Telephone Encounter (Signed)
See previous phone note.Stacey Grimes

## 2013-05-02 NOTE — Telephone Encounter (Signed)
pt reports that she restarted the bp meds on Friday and took her bp 132/76 Saturday ; 149/79 Sunday. Pt stated that she feels better since restarting the meds. She will f/u with Dr. Madilyn Fireman. I told her that should she begin to feel worse to call and schedule an appt during office hours or if after office hours to go to UC or ED.Audelia Hives [place]

## 2013-05-11 ENCOUNTER — Ambulatory Visit (INDEPENDENT_AMBULATORY_CARE_PROVIDER_SITE_OTHER): Payer: Medicare Other

## 2013-05-11 DIAGNOSIS — K838 Other specified diseases of biliary tract: Secondary | ICD-10-CM

## 2013-05-11 DIAGNOSIS — I77811 Abdominal aortic ectasia: Secondary | ICD-10-CM

## 2013-05-11 DIAGNOSIS — R7989 Other specified abnormal findings of blood chemistry: Secondary | ICD-10-CM

## 2013-05-11 DIAGNOSIS — R945 Abnormal results of liver function studies: Secondary | ICD-10-CM

## 2013-05-11 DIAGNOSIS — I700 Atherosclerosis of aorta: Secondary | ICD-10-CM

## 2013-08-05 ENCOUNTER — Other Ambulatory Visit: Payer: Self-pay | Admitting: Family Medicine

## 2013-12-04 ENCOUNTER — Other Ambulatory Visit: Payer: Self-pay | Admitting: Family Medicine

## 2014-01-10 ENCOUNTER — Other Ambulatory Visit: Payer: Self-pay | Admitting: Family Medicine

## 2014-02-06 ENCOUNTER — Other Ambulatory Visit: Payer: Self-pay | Admitting: Family Medicine

## 2014-02-06 NOTE — Telephone Encounter (Signed)
Left message for patient to call back to schedule a follow up appointment.

## 2014-02-21 ENCOUNTER — Encounter: Payer: Self-pay | Admitting: Family Medicine

## 2014-02-21 ENCOUNTER — Ambulatory Visit (INDEPENDENT_AMBULATORY_CARE_PROVIDER_SITE_OTHER): Payer: Medicare Other | Admitting: Family Medicine

## 2014-02-21 VITALS — BP 132/82 | HR 85 | Temp 97.9°F | Wt 104.0 lb

## 2014-02-21 DIAGNOSIS — I10 Essential (primary) hypertension: Secondary | ICD-10-CM

## 2014-02-21 DIAGNOSIS — J069 Acute upper respiratory infection, unspecified: Secondary | ICD-10-CM

## 2014-02-21 DIAGNOSIS — E875 Hyperkalemia: Secondary | ICD-10-CM

## 2014-02-21 DIAGNOSIS — E785 Hyperlipidemia, unspecified: Secondary | ICD-10-CM

## 2014-02-21 MED ORDER — LISINOPRIL 10 MG PO TABS: ORAL_TABLET | ORAL | Status: DC

## 2014-02-21 NOTE — Progress Notes (Signed)
   Subjective:    Patient ID: Stacey Grimes, female    DOB: 1940/07/05, 73 y.o.   MRN: [id number]  HPI Hypertension- Pt denies chest pain, SOB, dizziness, or heart palpitations.  Taking meds as directed w/o problems.  Denies medication side effects.  Has been undermore stress with family visiting.    Hyperkalemia - last potassium was elevated in Jan. Recheck in [place] was normal.  She is on an ACE.  No other causes for elevated Potassium.   Had and URI for about 3 weeks and says she is feeling better. No fever.  She mostly had nasal symptoms and cough. She says the cough is slowly improving still occasionally gets some small amount of yellow secretions but overall is feeling significantly better. She was not treated with antibiotics. She says it started right after she got her flu shot and was concerned that it may have been the flu.  Hyperlipidemia-tiring statin well without any side effects or palms. She says she takes her medications consistently. Review of Systems     Objective:   Physical Exam  Constitutional: She is oriented to person, place, and time. She appears well-developed and well-nourished.  HENT:  Head: Normocephalic and atraumatic.  Right Ear: External ear normal.  Left Ear: External ear normal.  Nose: Nose normal.  Mouth/Throat: Oropharynx is clear and moist.  TMs and canals are clear.   Eyes: Conjunctivae and EOM are normal. Pupils are equal, round, and reactive to light.  Neck: Neck supple. No thyromegaly present.  Cardiovascular: Normal rate, regular rhythm and normal heart sounds.   Pulmonary/Chest: Effort normal and breath sounds normal. She has no wheezes.  Lymphadenopathy:    She has no cervical adenopathy.  Neurological: She is alert and oriented to person, place, and time.  Skin: Skin is warm and dry.  Psychiatric: She has a normal mood and affect.          Assessment & Plan:  HTN-  Repeat BP was better.  F/U in 6 months. Due for labwork. Lab slip  provided today. Encouraged her to go when she is able to fast. Follow-up in 6 months.  Upper respiratory infection-most like a consistent with bronchitis versus actual influenza as we have not seen any actual cases of influenza in our clinic. Her flu shot is up-to-date. Next  Hyperlipidemia-due to repeat lipid panel she's curly taking Crestor 20 mg.  Hyperkalemia - due for potassium check.

## 2014-02-23 ENCOUNTER — Other Ambulatory Visit: Payer: Self-pay | Admitting: Family Medicine

## 2014-04-10 ENCOUNTER — Other Ambulatory Visit: Payer: Self-pay | Admitting: Family Medicine

## 2014-04-24 ENCOUNTER — Other Ambulatory Visit: Payer: Self-pay | Admitting: Family Medicine

## 2014-04-24 NOTE — Telephone Encounter (Signed)
Due for cholesterol labs. No further refills until labs obtained

## 2014-06-03 ENCOUNTER — Other Ambulatory Visit: Payer: Self-pay | Admitting: Family Medicine

## 2014-06-21 ENCOUNTER — Encounter: Payer: Self-pay | Admitting: Family Medicine

## 2014-06-21 ENCOUNTER — Ambulatory Visit (INDEPENDENT_AMBULATORY_CARE_PROVIDER_SITE_OTHER): Payer: Medicare Other | Admitting: Family Medicine

## 2014-06-21 VITALS — BP 160/100 | HR 83 | Wt 106.0 lb

## 2014-06-21 DIAGNOSIS — H539 Unspecified visual disturbance: Secondary | ICD-10-CM

## 2014-06-21 DIAGNOSIS — H53453 Other localized visual field defect, bilateral: Secondary | ICD-10-CM

## 2014-06-21 DIAGNOSIS — G43109 Migraine with aura, not intractable, without status migrainosus: Secondary | ICD-10-CM

## 2014-06-21 DIAGNOSIS — H53413 Scotoma involving central area, bilateral: Secondary | ICD-10-CM

## 2014-06-21 DIAGNOSIS — I10 Essential (primary) hypertension: Secondary | ICD-10-CM

## 2014-06-21 DIAGNOSIS — Z720 Tobacco use: Secondary | ICD-10-CM

## 2014-06-21 MED ORDER — LISINOPRIL 20 MG PO TABS: 20.0000 mg | ORAL_TABLET | Freq: Every day | ORAL | Status: DC

## 2014-06-21 MED ORDER — BUTALBITAL-ASPIRIN-CAFFEINE 50-325-40 MG PO CAPS: 1.0000 | ORAL_CAPSULE | Freq: Two times a day (BID) | ORAL | Status: DC | PRN

## 2014-06-21 NOTE — Progress Notes (Signed)
   Subjective:    Patient ID: Stacey Grimes, female    DOB: 1940-06-29, 74 y.o.   MRN: [id number]  HPI Follow-up emergency department visit. She was seen in the emergency department 3 days ago March 27. She reported visual disturbances that she described as waves in her vision. She also complained of dizziness with changes in position. As well as headache. She did not have any nausea vomiting or diarrhea or fever or chills at that time. Her blood pressure was 152/72. They diagnosed her with uncontrolled blood pressure as well as migraine headache. They gave her a short supply of lorazepam 3 times a day as needed for anxiety.  She says she took it but it gave her hallucinations. She was seeing people and animals.   They did give her IV Decadron and Reglan and Benadryl for the headache.  Today she feels light headed.  HA is better but feels like her vision is better but not back to baseline.  She is still having streaks in her vision.  Says episodes are occuring about every 6 months.    Tob abuse - she has cut back to half a pack a day for he last 2 months. She is wanting to quit.  Ed doc sent in a rx for Chantix.    Review of Systems     Objective:   Physical Exam  Constitutional: She is oriented to person, place, and time. She appears well-developed and well-nourished.  HENT:  Head: Normocephalic and atraumatic.  Right Ear: External ear normal.  Left Ear: External ear normal.  Nose: Nose normal.  Mouth/Throat: Oropharynx is clear and moist.  TMs and canals are clear.   Eyes: Conjunctivae and EOM are normal. Pupils are equal, round, and reactive to light.  Neck: Neck supple. No thyromegaly present.  Cardiovascular: Normal rate, regular rhythm and normal heart sounds.   Pulmonary/Chest: Effort normal and breath sounds normal. She has no wheezes.  Lymphadenopathy:    She has no cervical adenopathy.  Neurological: She is alert and oriented to person, place, and time.  Skin: Skin is warm  and dry.  Psychiatric: She has a normal mood and affect.          Assessment & Plan:  Hypertension - Uncontrolled. Inc lisinopril to 20mg . F/U in 2-3 weeks.   Migraine- She only has 1-2 a year. Will try ot find something that she can tx acutely.  Unfortunately, she is really not a candidate for triptan.  Will ry fiorinal   Tob abuse - she says she was given Chantix in the ED,  Discussed potential S.E. She will call if any problems.

## 2014-07-07 ENCOUNTER — Telehealth: Payer: Self-pay | Admitting: Family Medicine

## 2014-07-07 ENCOUNTER — Encounter: Payer: Self-pay | Admitting: Family Medicine

## 2014-07-07 ENCOUNTER — Ambulatory Visit (INDEPENDENT_AMBULATORY_CARE_PROVIDER_SITE_OTHER): Payer: Medicare Other | Admitting: Family Medicine

## 2014-07-07 VITALS — BP 158/100 | HR 94 | Ht 62.0 in | Wt 103.0 lb

## 2014-07-07 DIAGNOSIS — Z1211 Encounter for screening for malignant neoplasm of colon: Secondary | ICD-10-CM | POA: Diagnosis not present

## 2014-07-07 DIAGNOSIS — R0989 Other specified symptoms and signs involving the circulatory and respiratory systems: Secondary | ICD-10-CM

## 2014-07-07 DIAGNOSIS — I10 Essential (primary) hypertension: Secondary | ICD-10-CM

## 2014-07-07 DIAGNOSIS — Z720 Tobacco use: Secondary | ICD-10-CM

## 2014-07-07 DIAGNOSIS — Z1231 Encounter for screening mammogram for malignant neoplasm of breast: Secondary | ICD-10-CM | POA: Diagnosis not present

## 2014-07-07 MED ORDER — AMLODIPINE BESYLATE 2.5 MG PO TABS: 2.5000 mg | ORAL_TABLET | Freq: Every day | ORAL | Status: DC

## 2014-07-07 NOTE — Telephone Encounter (Signed)
Pt informed.Stacey Grimes  

## 2014-07-07 NOTE — Progress Notes (Addendum)
   Subjective:    Patient ID: Stacey Grimes, female    DOB: 1940/05/04, 74 y.o.   MRN: [id number]  HPI Hypertension- WE inc her lisinopril to 20mg  2 weeks ago.  Pt denies chest pain, SOB, dizziness, or heart palpitations.  Taking meds as directed w/o problems.  Denies medication side effects.  Still having HA some.   Tob abuse - pt reports that she did try the chantix but it caused her to feel bad, made her muscles hurt, she took the last dose on Wednesday.  It also made her dizzy. She has decided to quit smoking by cutting back. She has been cutting back to one cig a day.    Review of Systems     Objective:   Physical Exam  Constitutional: She is oriented to person, place, and time. She appears well-developed and well-nourished.  HENT:  Head: Normocephalic and atraumatic.  Cardiovascular: Normal rate, regular rhythm and normal heart sounds.   Left carotid bruits, mild.   Pulmonary/Chest: Effort normal and breath sounds normal.  Neurological: She is alert and oriented to person, place, and time.  Skin: Skin is warm and dry.  Psychiatric: She has a normal mood and affect. Her behavior is normal.          Assessment & Plan:  HTN - Still uncontrolled. We'll add low-dose amlodipine 2.5 mg. I want to go very slowly so that I don't drop her blood pressure and she feels bad. I like to see her back in about 4 weeks to see if it's getting it under control. Because we did increase her ACE inhibitor would like her to the lab today to recheck potassium and kidney function.  Tob abuse - She has now cut down to 1 a day on her own.    Left carotid bruit. Her last ultrasound was about 5 years ago. Will schedule to repeat.  She's okay with referral for screening colonoscopy as well as mammogram. Place orders for both these today.

## 2014-07-07 NOTE — Telephone Encounter (Signed)
Please call patient: Stacey Grimes a low dose and amlodipine. This is a calcium channel blocker. She's to take it once a day. Like to see her back in about 4 weeks to make sure that her blood pressure is responding.

## 2014-07-11 ENCOUNTER — Other Ambulatory Visit: Payer: Self-pay | Admitting: Family Medicine

## 2014-07-11 ENCOUNTER — Other Ambulatory Visit: Payer: Self-pay | Admitting: *Deleted

## 2014-07-11 LAB — LIPID PANEL
Cholesterol: 185 mg/dL (ref 0–200)
HDL: 44 mg/dL — AB (ref >=46)
LDL CALC: 103 mg/dL — AB (ref 0–99)
TRIGLYCERIDES: 190 mg/dL — AB (ref <150)
Total CHOL/HDL Ratio: 4.2 Ratio
VLDL: 38 mg/dL (ref 0–40)

## 2014-07-11 LAB — COMPLETE METABOLIC PANEL WITH GFR
ALK PHOS: 62 U/L (ref 39–117)
ALT: 9 U/L (ref 0–35)
AST: 19 U/L (ref 0–37)
Albumin: 3.7 g/dL (ref 3.5–5.2)
BILIRUBIN TOTAL: 0.4 mg/dL (ref 0.2–1.2)
BUN: 20 mg/dL (ref 6–23)
CO2: 26 meq/L (ref 19–32)
CREATININE: 1.3 mg/dL — AB (ref 0.50–1.10)
Calcium: 8.3 mg/dL — ABNORMAL LOW (ref 8.4–10.5)
Chloride: 101 mEq/L (ref 96–112)
GFR, EST AFRICAN AMERICAN: 47 mL/min — AB
GFR, Est Non African American: 41 mL/min — ABNORMAL LOW
Glucose, Bld: 118 mg/dL — ABNORMAL HIGH (ref 70–99)
Potassium: 4.4 mEq/L (ref 3.5–5.3)
Sodium: 137 mEq/L (ref 135–145)
TOTAL PROTEIN: 6.9 g/dL (ref 6.0–8.3)

## 2014-07-11 MED ORDER — ROSUVASTATIN CALCIUM 20 MG PO TABS: ORAL_TABLET | ORAL | Status: DC

## 2014-07-12 ENCOUNTER — Ambulatory Visit: Payer: Medicare Other | Admitting: Family Medicine

## 2014-07-13 ENCOUNTER — Other Ambulatory Visit: Payer: Self-pay | Admitting: *Deleted

## 2014-07-18 ENCOUNTER — Ambulatory Visit: Payer: Medicare Other | Admitting: Family Medicine

## 2014-07-24 ENCOUNTER — Ambulatory Visit (INDEPENDENT_AMBULATORY_CARE_PROVIDER_SITE_OTHER): Payer: Medicare Other | Admitting: Physician Assistant

## 2014-07-24 ENCOUNTER — Encounter: Payer: Self-pay | Admitting: Physician Assistant

## 2014-07-24 VITALS — BP 113/68 | HR 70 | Wt 102.0 lb

## 2014-07-24 DIAGNOSIS — C3492 Malignant neoplasm of unspecified part of left bronchus or lung: Secondary | ICD-10-CM

## 2014-07-24 DIAGNOSIS — N183 Chronic kidney disease, stage 3 unspecified: Secondary | ICD-10-CM

## 2014-07-24 DIAGNOSIS — G459 Transient cerebral ischemic attack, unspecified: Secondary | ICD-10-CM | POA: Diagnosis not present

## 2014-07-24 DIAGNOSIS — J95811 Postprocedural pneumothorax: Secondary | ICD-10-CM

## 2014-07-24 DIAGNOSIS — E875 Hyperkalemia: Secondary | ICD-10-CM

## 2014-07-24 MED ORDER — OXYCODONE HCL 5 MG PO CAPS: 5.0000 mg | ORAL_CAPSULE | ORAL | Status: DC | PRN

## 2014-07-24 NOTE — Progress Notes (Signed)
Subjective:    Patient ID: Stacey Grimes, female    DOB: [birth year], 74 y.o.   MRN: [id number]  HPI  Pt is a 74 yo female who presents to the clinic for follow up hospital visit on 07/11/2014 with TIA per note symptoms lasting 30 minutes as well as lung biopsy with adenocarcinoma.   1. TIA - Was admitted with TIA, 30 minutes of R sided paresthesias and altered speech/aphasia. Symptoms resolved in the ED. CT head showed no acute abnormality. 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 ECHO shows normal EF, no shunt. CT head normal on admission. Repeat CT head on 07/14/14 also shows no acute abnormality. We could not get MRI due to severe claustrophobia. Continue ASA 325 mg daily. Recommended cardiac event monitor at discharge per Neuro consultant and will need follow-up at stroke bridge clinic, scheduled for 07/26/14 at 0900.   2. Hypertension - improved with increased dosage of Norvasc. Continue on Norvasc 10 mg and Lisinopril 20 mg daily. Clonidine as needed for elevated BP.   3. Dyslipidemia - continue on statin. LDL 82.   4. Tobacco use - counseled on smoking cessation. Patient is willing to quit and had been cutting down, only was smoking 1-3 cigarettes/day prior to admission and was trying to quit.   5. GERD - increased PPI to bid due to need for daily ASA.   6. CKD stage 3 - Cr improved to 1.30 with IVF. Due to poor oral intake. Will recheck bmp in am.   7. Lung mass lung biosy with adenocarcinoma (I cannot stage further, defer to oncology or outpatient follow-up) - PET shows possible primary lung cancer limited to L hilum. Discussed with Pulmonology who reviewed PET scan and recommended CT-guided needle biopsy for better tissue sample rather than bronchoscopy (patient had bronchoscopy about one month ago). IR performed CT guided needle biopsy of L lung, 07/14/14. Biopsy results with adenocarcinoma. Family wants to see oncology while here: consulted hem-onc. Recommended brain imaging to  look for mets. MRI brain aborted for unclear reason, CrCl 34.   8. Dysphagia/difficulty swallowing - Speech therapy consulted for swallow evaluation. No S/S of aspiration, normal swallow eval. Consulted GI for evaluation due to history of esophageal stricture and dilation. Discussed with [facility] Britta Mccreedy with [facility]. Plan for EGD after chest tube is removed and lung function stable per GI note. Nutrition consult was placed and agree with changing to mechanical soft due to difficulty swallowing.   9. Hyperkalemia - Resolved after treatment, [K+] stable at 4.9. Will monitor with bmp.   10. Pneumothorax s/p biopsy. Acute hypoxic respiratory failure. - S/P CT guided lung biopsy patient had symptomatic pneumothorax requiring L chest tube placement. Chest tube removed, 07/18/2014 by IR. SpO2 88% on room air, 93 on 3-LPM by nasal cannula.    Review of Systems  All other systems reviewed and are negative.      Objective:   Physical Exam  Constitutional: She is oriented to person, place, and time.  Pt appears weak and in wheelchair today.   HENT:  Head: Normocephalic and atraumatic.  Cardiovascular: Normal rate, regular rhythm and normal heart sounds.   Left bruit.  Pulmonary/Chest: Effort normal and breath sounds normal.  Neurological: She is alert and oriented to person, place, and time.  Psychiatric: She has a normal mood and affect. Her behavior is normal.          Assessment & Plan:  Adenocarcinoma with pneumothorax after biopsy- will follow up with pulmonology/oncology tomorrow  at 1:30. On 4L of O2. Not sure of doctors name. Oxycodone refilled from hospital for one month.   Left carotid occulsion- we are attempting to get her in ASAP for discussion of stent placement. See results of U/S in hospital. See note from [facility].   Hyperkalemia- recheck BMP.   TIA- neurological exam essentially normal with some minor weakness right lower extremity. Stroke clinic follow up is may 4th and 9:00. On ASA  '325mg'$  daily.   Follow up with metheney in next 3-4 weeks

## 2014-07-24 NOTE — Patient Instructions (Signed)
Stroke center appt may 4 at 9:00. Will call with Dr. Wilber Bihari appt.

## 2014-07-25 ENCOUNTER — Telehealth: Payer: Self-pay | Admitting: Family Medicine

## 2014-07-25 DIAGNOSIS — C3490 Malignant neoplasm of unspecified part of unspecified bronchus or lung: Secondary | ICD-10-CM | POA: Insufficient documentation

## 2014-07-25 DIAGNOSIS — J95811 Postprocedural pneumothorax: Secondary | ICD-10-CM | POA: Insufficient documentation

## 2014-07-25 DIAGNOSIS — G459 Transient cerebral ischemic attack, unspecified: Secondary | ICD-10-CM | POA: Insufficient documentation

## 2014-07-25 DIAGNOSIS — N183 Chronic kidney disease, stage 3 unspecified: Secondary | ICD-10-CM | POA: Insufficient documentation

## 2014-07-25 LAB — BASIC METABOLIC PANEL
BUN: 11 mg/dL (ref 6–23)
CALCIUM: 8.8 mg/dL (ref 8.4–10.5)
CO2: 34 mEq/L — ABNORMAL HIGH (ref 19–32)
CREATININE: 1.28 mg/dL — AB (ref 0.50–1.10)
Chloride: 92 mEq/L — ABNORMAL LOW (ref 96–112)
GLUCOSE: 99 mg/dL (ref 70–99)
Potassium: 4.1 mEq/L (ref 3.5–5.3)
SODIUM: 141 meq/L (ref 135–145)

## 2014-07-25 NOTE — Telephone Encounter (Signed)
Pt was evaluated in our clinic and it was determined to be beneficial if Pt could see Dr. Rachell Cipro for a follow up since he is familiar with Pt's history. Pt has the following appts scheduled already:  07/25/14 - Oncology 07/26/14 - Stroke Clinic 08/02/14 - Neurology  Missed a call from [place] [phone]) with Dr. Verneita Griffes office, attempted to call back. Spoke with a different receptionist who informed there was an appt for today with Dr. Rachell Cipro but it was cancelled. She advised I speak with Nanette, was transferred to her line and sent to voicemail. Left detailed message regarding importance of Pt seeing Dr. Rachell Cipro and to contact Pt for scheduling. Provided my information for further questions.

## 2014-07-28 ENCOUNTER — Ambulatory Visit (HOSPITAL_BASED_OUTPATIENT_CLINIC_OR_DEPARTMENT_OTHER): Payer: Medicare Other

## 2014-07-31 ENCOUNTER — Telehealth: Payer: Self-pay | Admitting: *Deleted

## 2014-07-31 NOTE — Telephone Encounter (Signed)
Pt was seen in ED and will need a f/u for this and Dr. Madilyn Fireman doesn't have any openings. Her next f/u appt is on 5/20. appt made for tomorrow at 945.Stacey Grimes

## 2014-08-01 ENCOUNTER — Encounter: Payer: Self-pay | Admitting: Family Medicine

## 2014-08-01 ENCOUNTER — Ambulatory Visit (INDEPENDENT_AMBULATORY_CARE_PROVIDER_SITE_OTHER): Payer: Medicare Other | Admitting: Family Medicine

## 2014-08-01 ENCOUNTER — Telehealth: Payer: Self-pay | Admitting: *Deleted

## 2014-08-01 VITALS — BP 126/66 | HR 86 | Wt 104.0 lb

## 2014-08-01 DIAGNOSIS — G459 Transient cerebral ischemic attack, unspecified: Secondary | ICD-10-CM | POA: Diagnosis not present

## 2014-08-01 DIAGNOSIS — G43109 Migraine with aura, not intractable, without status migrainosus: Secondary | ICD-10-CM

## 2014-08-01 DIAGNOSIS — C3490 Malignant neoplasm of unspecified part of unspecified bronchus or lung: Secondary | ICD-10-CM

## 2014-08-01 MED ORDER — OXYCODONE HCL 5 MG PO CAPS: 5.0000 mg | ORAL_CAPSULE | ORAL | Status: DC | PRN

## 2014-08-01 NOTE — Telephone Encounter (Signed)
lvm for [facility] nurse for Dr. Georgiann Cocker asking that she either call pt or rtn my call in regards to pt's next f/u appt with Dr. Georgiann Cocker.Stacey Grimes [place]
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
## 2014-08-02 ENCOUNTER — Encounter: Payer: Self-pay | Admitting: Family Medicine

## 2014-08-02 ENCOUNTER — Other Ambulatory Visit: Payer: Self-pay | Admitting: Family Medicine

## 2014-08-11 ENCOUNTER — Ambulatory Visit: Payer: Medicare Other | Admitting: Family Medicine

## 2014-08-19 LAB — CBC AND DIFFERENTIAL
Hemoglobin: 11.9 g/dL — AB (ref 12.0–16.0)
PLATELETS: 259 10*3/uL (ref 150–399)
WBC: 5.7 10^3/mL

## 2014-08-19 LAB — BASIC METABOLIC PANEL
BUN: 19 mg/dL (ref 4–21)
Creatinine: 1.4 mg/dL — AB (ref 0.5–1.1)
Potassium: 3.9 mmol/L (ref 3.4–5.3)
Sodium: 141 mmol/L (ref 137–147)

## 2014-08-19 LAB — HEPATIC FUNCTION PANEL
ALT: 6 U/L — AB (ref 7–35)
AST: 15 U/L (ref 13–35)
Alkaline Phosphatase: 60 U/L (ref 25–125)

## 2014-08-19 LAB — CALCIUM: CALCIUM: 8.5 mg/dL

## 2014-08-19 LAB — ESTIMATED GFR: GFR calc non Af Amer: 37

## 2014-08-24 ENCOUNTER — Encounter: Payer: Self-pay | Admitting: Family Medicine

## 2014-08-28 ENCOUNTER — Other Ambulatory Visit: Payer: Self-pay | Admitting: *Deleted

## 2014-08-28 DIAGNOSIS — G43109 Migraine with aura, not intractable, without status migrainosus: Secondary | ICD-10-CM

## 2014-08-28 MED ORDER — TRAMADOL HCL 50 MG PO TABS: 50.0000 mg | ORAL_TABLET | Freq: Three times a day (TID) | ORAL | Status: DC | PRN

## 2014-08-28 MED ORDER — OXYCODONE HCL 5 MG PO CAPS: 5.0000 mg | ORAL_CAPSULE | ORAL | Status: DC | PRN

## 2014-09-07 ENCOUNTER — Ambulatory Visit (INDEPENDENT_AMBULATORY_CARE_PROVIDER_SITE_OTHER): Payer: Medicare Other | Admitting: Family Medicine

## 2014-09-07 VITALS — BP 141/70 | HR 94 | Wt 97.0 lb

## 2014-09-07 DIAGNOSIS — I10 Essential (primary) hypertension: Secondary | ICD-10-CM

## 2014-09-07 MED ORDER — AMLODIPINE BESYLATE 10 MG PO TABS: 10.0000 mg | ORAL_TABLET | Freq: Every evening | ORAL | Status: DC

## 2014-09-07 MED ORDER — LISINOPRIL 10 MG PO TABS: 10.0000 mg | ORAL_TABLET | ORAL | Status: DC

## 2014-09-07 NOTE — Progress Notes (Signed)
   Subjective:    Patient ID: Stacey Grimes, female    DOB: [birth year], 74 y.o.   MRN: [id number]  HPI  Patient is here for blood pressurecheck. Patient states she needs a refill on her medications, and hasn't been able to come in for her regular appts with Dr. Madilyn Fireman due to Chemo appts.   Review of Systems     Objective:   Physical Exam        Assessment & Plan:  Patient BP was high today, states she took her BP Rx "about 30 min ago." Pt brought in her Rx's today to show me what exactly she is taking. Pt is taking her: Alprazolam, amlodipine, lisinopril, and pantoprazole. All other Rx's on her med list were stopped due to recommendation from her oncologist. Advised I would route to PCP for review then giver her a call regarding refills. No further questions.

## 2014-11-28 ENCOUNTER — Ambulatory Visit: Payer: Medicare Other | Admitting: Family Medicine

## 2014-11-30 ENCOUNTER — Other Ambulatory Visit: Payer: Self-pay | Admitting: Family Medicine

## 2015-01-15 ENCOUNTER — Ambulatory Visit (INDEPENDENT_AMBULATORY_CARE_PROVIDER_SITE_OTHER): Payer: Medicare Other | Admitting: Family Medicine

## 2015-01-15 ENCOUNTER — Encounter: Payer: Self-pay | Admitting: Family Medicine

## 2015-01-15 VITALS — BP 132/68 | HR 79 | Wt 95.6 lb

## 2015-01-15 DIAGNOSIS — G43109 Migraine with aura, not intractable, without status migrainosus: Secondary | ICD-10-CM

## 2015-01-15 DIAGNOSIS — C3492 Malignant neoplasm of unspecified part of left bronchus or lung: Secondary | ICD-10-CM

## 2015-01-15 DIAGNOSIS — G629 Polyneuropathy, unspecified: Secondary | ICD-10-CM

## 2015-01-15 DIAGNOSIS — E538 Deficiency of other specified B group vitamins: Secondary | ICD-10-CM

## 2015-01-15 DIAGNOSIS — I10 Essential (primary) hypertension: Secondary | ICD-10-CM | POA: Diagnosis not present

## 2015-01-15 DIAGNOSIS — I739 Peripheral vascular disease, unspecified: Secondary | ICD-10-CM

## 2015-01-15 MED ORDER — AMBULATORY NON FORMULARY MEDICATION: Status: DC

## 2015-01-15 MED ORDER — PROPRANOLOL HCL 20 MG PO TABS: 20.0000 mg | ORAL_TABLET | Freq: Two times a day (BID) | ORAL | Status: DC

## 2015-01-15 NOTE — Progress Notes (Signed)
   Subjective:    Patient ID: Stacey Grimes, female    DOB: [birth year], 74 y.o.   MRN: [id number]  HPI Hypertension- Pt denies chest pain, SOB, dizziness, or heart palpitations.  Taking meds as directed w/o problems.  Denies medication side effects.    Has had flu shot, last Monday.  Needs a rx for the shingles vaccine.   Still having some HA. She has been taking excedrin. Says has a bad one about once a month but will have more mild HA.    PVD- Still having a lot of pain in the right leg. Has seen Dr. Apolonio Schneiders and they have discussed bypass surgery.   Says when she had ABIs a month ago she ws told her heart beat irregular.  She has never been told that and is concerned.  No CP and she denies any palpitations.   Lung Ca - She has been thorugh 30 radiation treatments and chemotherapy for lung Ca.  She has been getting some numbness in her hands.  She had lost down 90 lbs.     Review of Systems     Objective:   Physical Exam  Constitutional: She is oriented to person, place, and time. She appears well-developed and well-nourished.  HENT:  Head: Normocephalic and atraumatic.  Neck: Neck supple. No thyromegaly present.  Cardiovascular: Normal rate, regular rhythm and normal heart sounds.   No carotid bruits.   Pulmonary/Chest: Effort normal and breath sounds normal.  Lymphadenopathy:    She has no cervical adenopathy.  Neurological: She is alert and oriented to person, place, and time.  Skin: Skin is warm and dry.  Psychiatric: She has a normal mood and affect. Her behavior is normal.          Assessment & Plan:  HTN - well controlled.  F/U in 6 months.    Migraine HA- we decided to put her on something prophylactic. Will put her on propranolol and have her cut her amlodipine in half. Follow-up in 6-8 weeks to make sure that she feels like her symptoms are improving.  Neuropathy- hx of B12 def.  She has been off her supplement for a few month.  Will recheck levels to see if  needs to start supplement again.    PVD - she has follow-up with her vascular surgeon in about 2 weeks to decide if they are going to do bypass surgery on her Lasix.  Lung Ca - has PET scan scheduled.  Last Ct showed regression of tumor.

## 2015-01-15 NOTE — Patient Instructions (Signed)
Will start propranolol for the headaches and cut your amlodipine in half.

## 2015-01-16 LAB — FOLATE: Folate: 19.8 ng/mL

## 2015-01-16 LAB — VITAMIN B12: Vitamin B-12: 484 pg/mL (ref 211–911)

## 2015-01-16 LAB — MAGNESIUM: Magnesium: 2.1 mg/dL (ref 1.5–2.5)

## 2015-01-18 LAB — VITAMIN B1: Vitamin B1 (Thiamine): 21 nmol/L (ref 8–30)

## 2015-03-16 ENCOUNTER — Other Ambulatory Visit: Payer: Self-pay | Admitting: Family Medicine

## 2015-05-13 ENCOUNTER — Other Ambulatory Visit: Payer: Self-pay | Admitting: Family Medicine

## 2015-05-22 ENCOUNTER — Other Ambulatory Visit: Payer: Self-pay | Admitting: Family Medicine

## 2015-06-12 ENCOUNTER — Encounter: Payer: Self-pay | Admitting: Family Medicine

## 2015-06-12 ENCOUNTER — Ambulatory Visit (INDEPENDENT_AMBULATORY_CARE_PROVIDER_SITE_OTHER): Payer: Medicare Other | Admitting: Family Medicine

## 2015-06-12 VITALS — BP 133/71 | HR 83 | Wt 94.0 lb

## 2015-06-12 DIAGNOSIS — J851 Abscess of lung with pneumonia: Secondary | ICD-10-CM | POA: Diagnosis not present

## 2015-06-12 DIAGNOSIS — C3492 Malignant neoplasm of unspecified part of left bronchus or lung: Secondary | ICD-10-CM | POA: Diagnosis not present

## 2015-06-12 NOTE — Progress Notes (Signed)
   Subjective:    Patient ID: Stacey Grimes, female    DOB: 1940-07-28, 75 y.o.   MRN: [id number]  HPI  75 year old with a history of lung cancer is here today for follow-up. She went to the emergency department 3 days ago at [facility].  She had a chest x-ray which showed a perihilar and left lower lobe infiltrate. As well as some chronic changes in the lung. The left hilar mass was not well visualized. She tested negative for flu. She was started on Levaquin and steroids and given some cough syrup. She also had a low calcium level which she has a previous history of and they recommended that she restart her home calcium supplement.He was having a lot of left rib pain and says that has actually gotten better in the last couple of days as well.  Review of Systems     Objective:   Physical Exam  Constitutional: She is oriented to person, place, and time. She appears well-developed and well-nourished.  HENT:  Head: Normocephalic and atraumatic.  Right Ear: External ear normal.  Left Ear: External ear normal.  Nose: Nose normal.  Mouth/Throat: Oropharynx is clear and moist.  TMs and canals are clear.   Eyes: Conjunctivae and EOM are normal. Pupils are equal, round, and reactive to light.  Neck: Neck supple. No thyromegaly present.  Cardiovascular: Normal rate, regular rhythm and normal heart sounds.   Pulmonary/Chest: Effort normal and breath sounds normal. She has no wheezes.  Lymphadenopathy:    She has no cervical adenopathy.  Neurological: She is alert and oriented to person, place, and time.  Skin: Skin is warm and dry.  Psychiatric: She has a normal mood and affect.          Assessment & Plan:  Adenocarcinoma of lung, stage III, left-sided-follows with Dr. Verdell Carmine. She is fortunately and clinical remission. She will follow-up in 1 months for repeat PET/CT scan.    Left lower lung pneumonia-she feels more than 50% improved after 3 days on antibiotics and prednisone. Make  sure to complete the antibiotics and prednisone. Use albuterol as needed. I think she is doing great. Call if she feels like she suddenly getting worse.

## 2015-07-06 ENCOUNTER — Other Ambulatory Visit: Payer: Self-pay | Admitting: Family Medicine

## 2015-07-16 ENCOUNTER — Telehealth: Payer: Self-pay

## 2015-07-16 NOTE — Telephone Encounter (Signed)
Stacey Grimes states Dr Madilyn Fireman prescribed oxycodone for a broken rib. She would like a refill. I don't see a prescription for oxycodone in the chart. Please advise.

## 2015-07-16 NOTE — Telephone Encounter (Signed)
Your are right.  I did rx hydrocodone a couple of years ago.  What does she need it for? We might be able ot fill it.

## 2015-07-17 NOTE — Telephone Encounter (Signed)
Unable to reach patient by phone. Phone message states patient has traveled outside of coverage area.

## 2015-08-05 ENCOUNTER — Other Ambulatory Visit: Payer: Self-pay | Admitting: Family Medicine

## 2015-08-07 ENCOUNTER — Telehealth: Payer: Self-pay | Admitting: Family Medicine

## 2015-08-07 NOTE — Telephone Encounter (Signed)
I called patient to let them know they will need a fu appointment for refills on Inderal patient did not have answering machine to leave a message. - CF

## 2015-08-30 ENCOUNTER — Other Ambulatory Visit: Payer: Self-pay | Admitting: Family Medicine

## 2015-09-24 IMAGING — US US ABDOMEN COMPLETE
1 series · 14 of 25 positions shown · non-contrast
Comparison: None.

CLINICAL DATA: Elevated liver function tests.  Elevated creatinine.

EXAM:
ULTRASOUND ABDOMEN COMPLETE

[Series 1: us abdomen complete · 0.26mm/px · 14 of 90 slices shown]
[im 1/90]
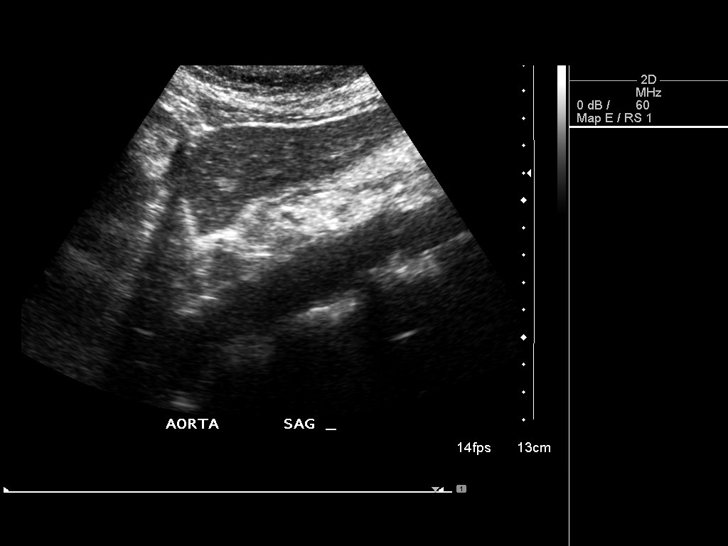
[im 8/90]
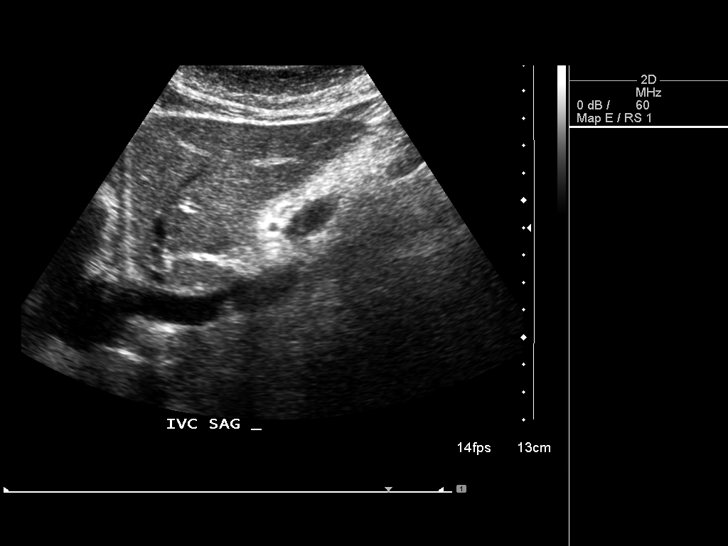
[im 15/90]
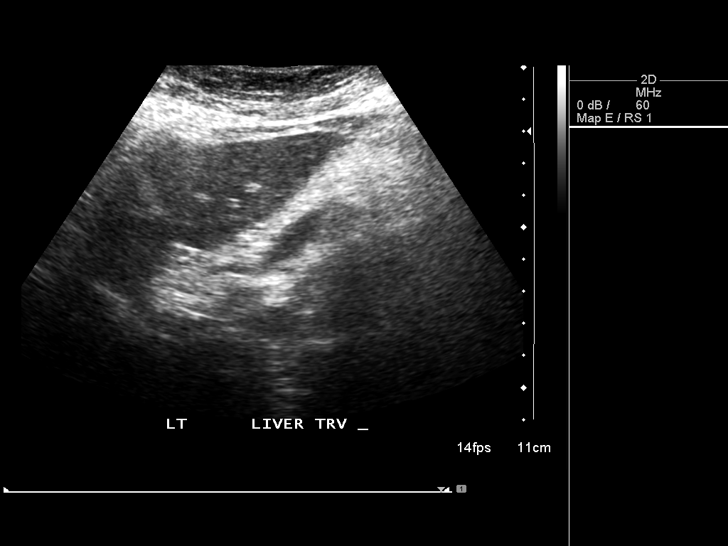
[im 23/90]
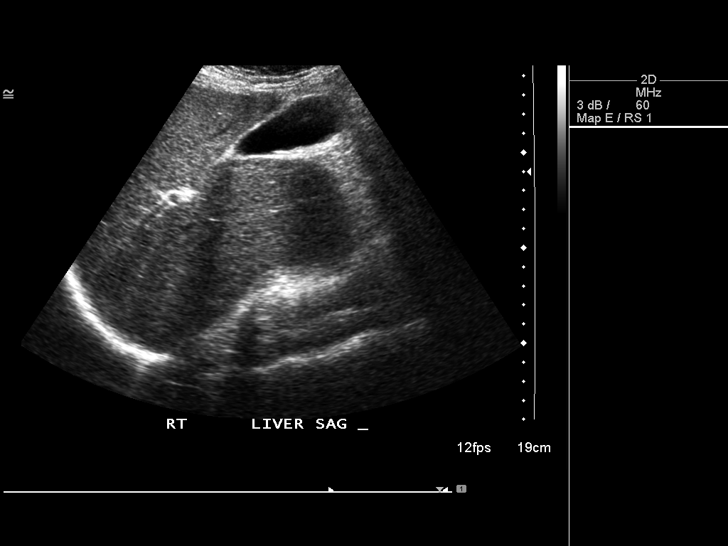
[im 30/90]
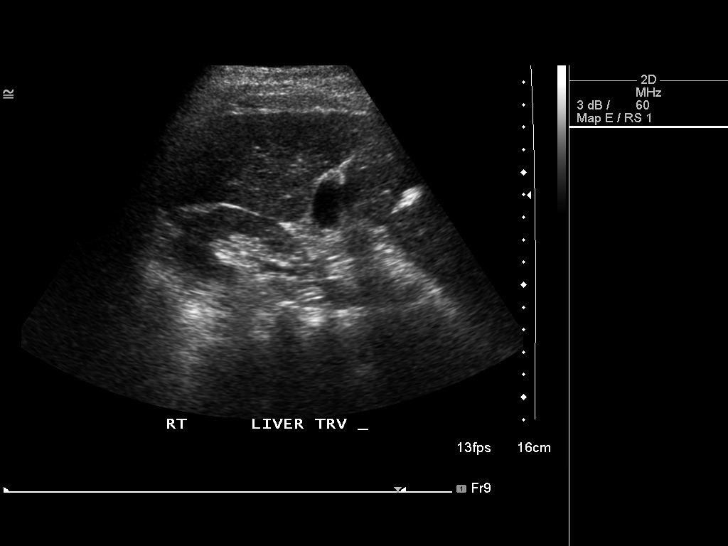
[im 34/90]
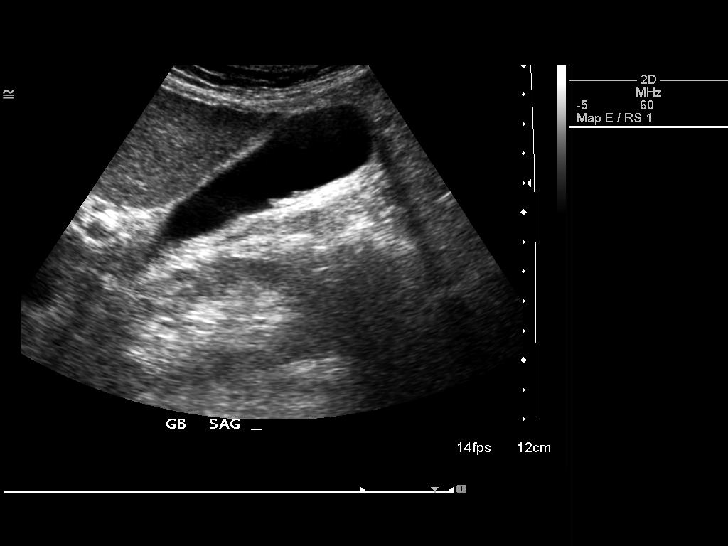
[im 41/90]
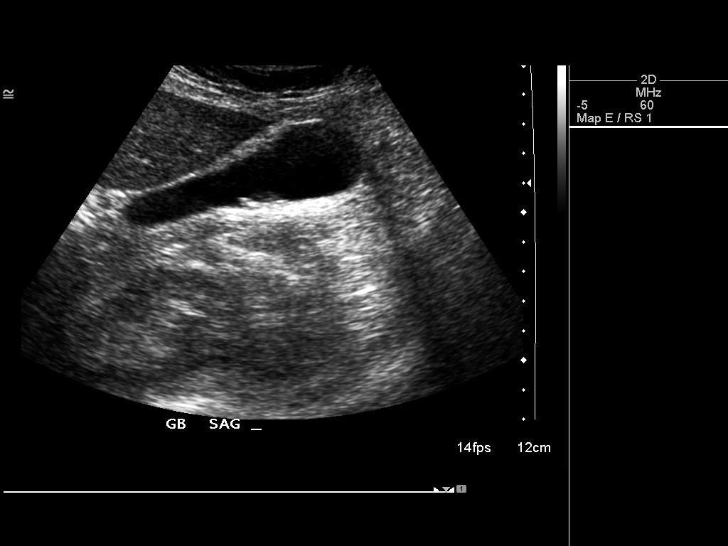
[im 49/90]
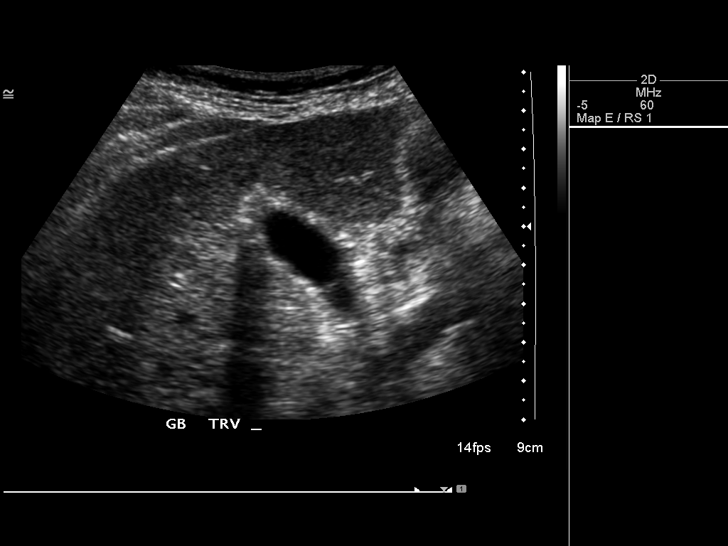
[im 56/90]
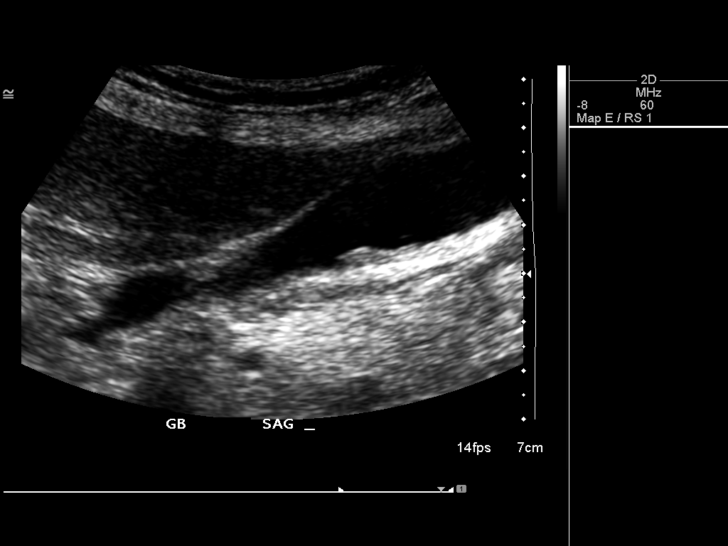
[im 60/90]
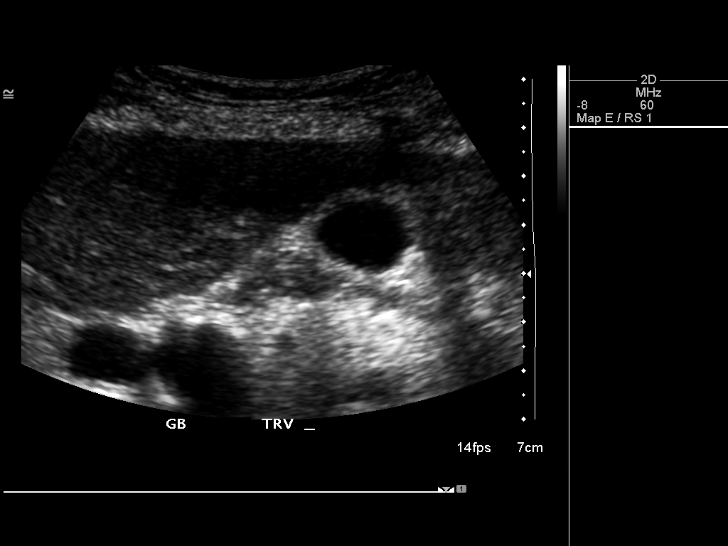
[im 67/90]
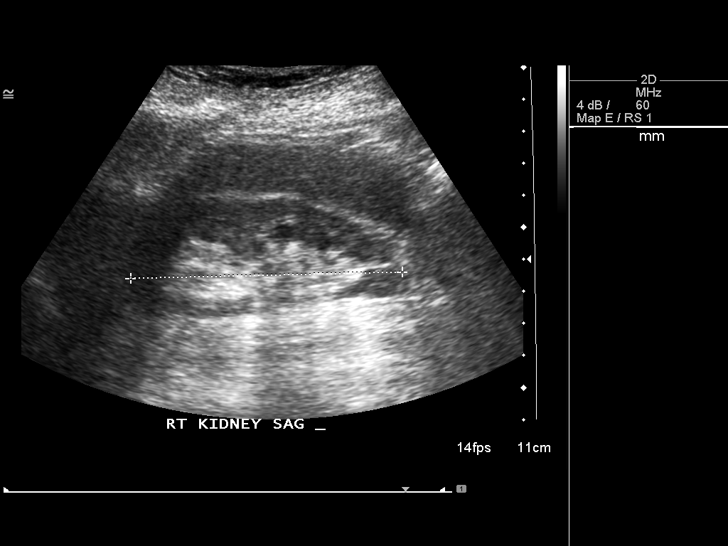
[im 75/90]
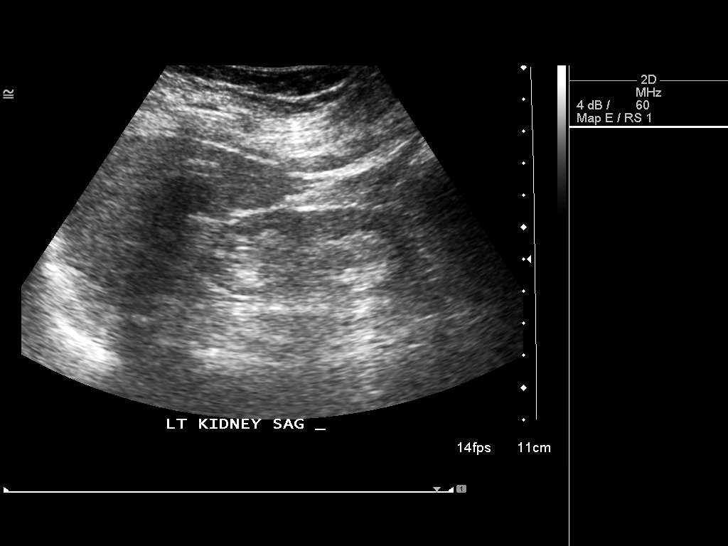
[im 82/90]
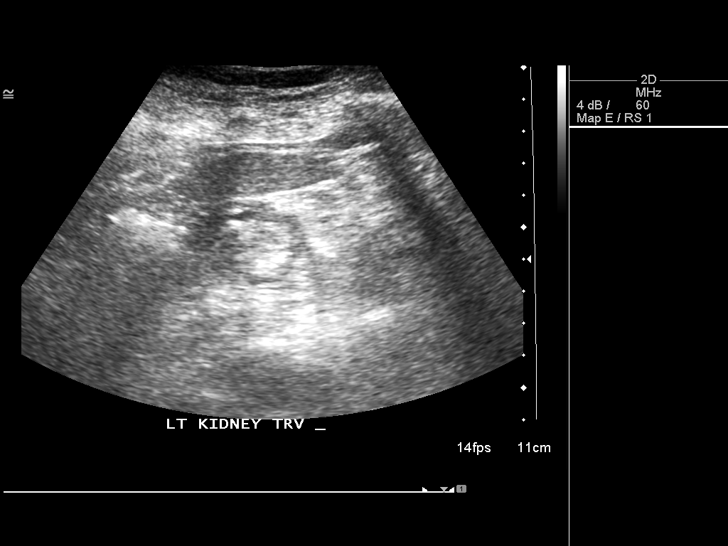
[im 90/90]
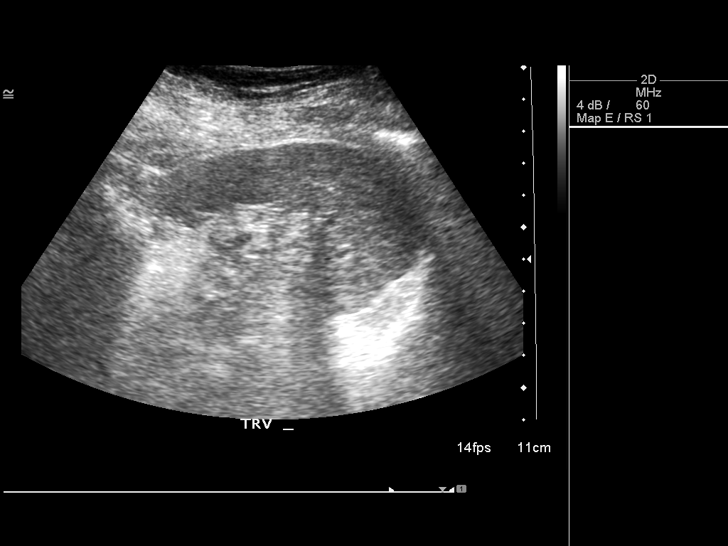

[14 of 25 positions shown; findings below may reference images not displayed]

FINDINGS: Gallbladder:

There is sludge in the gallbladder. Gallbladder wall is not
thickened. Negative sonographic Murphy's sign.

Common bile duct:

Diameter: 1.7 mm, normal.

Liver:

Normal.  No focal lesions.

IVC:

Visualized portion is normal.  Most of the IVC is obscured by bowel.

Pancreas:

Not well visualized.

Spleen:

Normal.  5.5 cm in length.

Right Kidney:

Length: 8.5 cm. Echogenicity within normal limits. No mass or
hydronephrosis visualized.

Left Kidney:

Length: 6.5 cm. Diffuse thinning of the renal cortex. No
hydronephrosis. Decreased perfusion to the left kidney as compared
to the right..

Abdominal aorta:

Diffuse atherosclerotic irregularity with a maximum diameter of
approximately 3.5 cm.

Other findings:

None.
IMPRESSION: 1. Slight dilatation of the abdominal aorta to a diameter of 3.5 cm
with diffuse atherosclerosis.
2. Small left kidney with decreased profusion suggesting of renal
artery stenosis.
3. Sludge in the gallbladder.
4. Normal appearing liver.
5. Pancreas is not well seen.

## 2015-11-23 ENCOUNTER — Other Ambulatory Visit: Payer: Self-pay | Admitting: *Deleted

## 2015-11-23 MED ORDER — PANTOPRAZOLE SODIUM 40 MG PO TBEC: DELAYED_RELEASE_TABLET | ORAL | 1 refills | Status: AC

## 2015-11-23 MED ORDER — PANTOPRAZOLE SODIUM 40 MG PO TBEC: DELAYED_RELEASE_TABLET | ORAL | 6 refills | Status: DC

## 2016-01-04 ENCOUNTER — Encounter: Payer: Self-pay | Admitting: Family Medicine

## 2016-01-04 ENCOUNTER — Ambulatory Visit (INDEPENDENT_AMBULATORY_CARE_PROVIDER_SITE_OTHER): Payer: Medicare Other | Admitting: Family Medicine

## 2016-01-04 ENCOUNTER — Telehealth: Payer: Self-pay | Admitting: *Deleted

## 2016-01-04 VITALS — BP 151/71 | HR 78 | Wt 99.0 lb

## 2016-01-04 DIAGNOSIS — M7989 Other specified soft tissue disorders: Secondary | ICD-10-CM | POA: Diagnosis not present

## 2016-01-04 DIAGNOSIS — R7989 Other specified abnormal findings of blood chemistry: Secondary | ICD-10-CM

## 2016-01-04 DIAGNOSIS — I739 Peripheral vascular disease, unspecified: Secondary | ICD-10-CM | POA: Diagnosis not present

## 2016-01-04 DIAGNOSIS — C3492 Malignant neoplasm of unspecified part of left bronchus or lung: Secondary | ICD-10-CM

## 2016-01-04 MED ORDER — HYDROCHLOROTHIAZIDE 12.5 MG PO CAPS: 12.5000 mg | ORAL_CAPSULE | Freq: Every day | ORAL | 1 refills | Status: AC

## 2016-01-04 NOTE — Telephone Encounter (Signed)
error 

## 2016-01-04 NOTE — Progress Notes (Signed)
Subjective:    CC:   HPI:  75 yo female c/o bilat foot swelling x 1 week and numbness in the hands.  She denies any chest pain or shortness of breath or any type of breathing issue. She denies any trauma to her feet. She initially had amlodipine on her medication list but when she showed [place] her medication bottles she actually was not on amlodipine. She is on chronic oxygen therapy. She's actually been turning up her oxygen to 5 L/m. She said she started that about 2 weeks ago and then started to notice the swelling about a week ago. No other medication changes. She did try an over-the-counter "fluid pill" and says it actually did seem to help. Her swelling was better in the morning. Significant dietary changes.  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 She is currently using 5 L. She said she had turned it up because she had such a long to that she felt like she wasn't getting enough oxygen. She turned it up about 2 weeks ago.  Past medical history, Surgical history, Family history not pertinant except as noted below, Social history, Allergies, and medications have been entered into the medical record, reviewed, and corrections made.   Review of Systems: No fevers, chills, night sweats, weight loss, chest pain, or shortness of breath.   Objective:    General: Well Developed, well nourished, and in no acute distress.  Neuro: Alert and oriented x3, extra-ocular muscles intact, sensation grossly intact.  HEENT: Normocephalic, atraumatic  Skin: Warm and dry, no rashes. Cardiac: Regular rate and rhythm, no murmurs rubs or gallops, no lower extremity edema.  Respiratory: Clear to auscultation bilaterally. Not using accessory muscles, speaking in full sentences. MSK: She does have 1+ edema on the top of both feet going up to her ankles and just at the distal tibia bilaterally.   Impression and Recommendations:   Lower extremity swelling-unclear etiology. Will  check for anemia, thyroid issues, proteinuria, urinary tract infection, and BNP to rule out congestive heart failure. She's not currently having any chest pain or shortness of breath. DVT would be unlikely since swelling is bilateral. She does have known peripheral arterial insufficiency.  Hypertension-uncontrolled. 500 chlorothiazide which could help lower blood pressure and help with her lower extremities swelling.  Adenocarcinoma the lungs-encouraged her to try to keep the oxygen at 3 L and not to go above that. I explained that sometimes at the oxygen is turned up a little too high but can actually be harmful.

## 2016-01-04 NOTE — Patient Instructions (Signed)
Try to keep your oxygen around 3 liter.

## 2016-01-05 ENCOUNTER — Telehealth: Payer: Self-pay | Admitting: Family Medicine

## 2016-01-05 LAB — URINALYSIS, MICROSCOPIC ONLY
Bacteria, UA: NONE SEEN [HPF]
CRYSTALS: NONE SEEN [HPF]
YEAST: NONE SEEN [HPF]

## 2016-01-05 LAB — CBC WITH DIFFERENTIAL/PLATELET
BASOS ABS: 0 {cells}/uL (ref 0–200)
Basophils Relative: 0 %
EOS ABS: 148 {cells}/uL (ref 15–500)
Eosinophils Relative: 2 %
HEMATOCRIT: 34.5 % — AB (ref 35.0–45.0)
HEMOGLOBIN: 10.7 g/dL — AB (ref 11.7–15.5)
LYMPHS ABS: 962 {cells}/uL (ref 850–3900)
Lymphocytes Relative: 13 %
MCH: 30.7 pg (ref 27.0–33.0)
MCHC: 31 g/dL — AB (ref 32.0–36.0)
MCV: 99.1 fL (ref 80.0–100.0)
MONO ABS: 740 {cells}/uL (ref 200–950)
MPV: 10.2 fL (ref 7.5–12.5)
Monocytes Relative: 10 %
NEUTROS PCT: 75 %
Neutro Abs: 5550 cells/uL (ref 1500–7800)
Platelets: 370 10*3/uL (ref 140–400)
RBC: 3.48 MIL/uL — ABNORMAL LOW (ref 3.80–5.10)
RDW: 15 % (ref 11.0–15.0)
WBC: 7.4 10*3/uL (ref 3.8–10.8)

## 2016-01-05 LAB — URINALYSIS, ROUTINE W REFLEX MICROSCOPIC
BILIRUBIN URINE: NEGATIVE
GLUCOSE, UA: NEGATIVE
KETONES UR: NEGATIVE
Leukocytes, UA: NEGATIVE
Nitrite: NEGATIVE
PH: 5.5 (ref 5.0–8.0)
SPECIFIC GRAVITY, URINE: 1.023 (ref 1.001–1.035)

## 2016-01-05 LAB — COMPLETE METABOLIC PANEL WITH GFR
ALT: 6 U/L (ref 6–29)
AST: 16 U/L (ref 10–35)
Albumin: 3.1 g/dL — ABNORMAL LOW (ref 3.6–5.1)
Alkaline Phosphatase: 55 U/L (ref 33–130)
BUN: 16 mg/dL (ref 7–25)
CHLORIDE: 100 mmol/L (ref 98–110)
CO2: 29 mmol/L (ref 20–31)
CREATININE: 1.25 mg/dL — AB (ref 0.60–0.93)
Calcium: 5.6 mg/dL — CL (ref 8.6–10.4)
GFR, Est African American: 49 mL/min — ABNORMAL LOW (ref >=60)
GFR, Est Non African American: 42 mL/min — ABNORMAL LOW (ref >=60)
Glucose, Bld: 93 mg/dL (ref 65–99)
Potassium: 4.8 mmol/L (ref 3.5–5.3)
Sodium: 143 mmol/L (ref 135–146)
Total Bilirubin: 0.3 mg/dL (ref 0.2–1.2)
Total Protein: 6.4 g/dL (ref 6.1–8.1)

## 2016-01-05 LAB — TSH: TSH: 1.69 mIU/L

## 2016-01-05 LAB — BRAIN NATRIURETIC PEPTIDE: Brain Natriuretic Peptide: 746.2 pg/mL — ABNORMAL HIGH (ref <100)

## 2016-01-05 NOTE — Telephone Encounter (Signed)
.  I received a call from the lab with a critically low calcium level of 5.6. With correction due to low albumin this improves to 6.3.  I called Stacey Grimes who says that she is feeling fine.  She will take OTC calcium pills twice daily and follow up with Dr Jerilynn Mages on Monday. I created an appointment with Dr Jerilynn Mages at 2pm on Monday.  Pt is aware.   Lab Results  Component Value Date   CALCIUM 5.6 (LL) 01/04/2016

## 2016-01-07 ENCOUNTER — Ambulatory Visit (INDEPENDENT_AMBULATORY_CARE_PROVIDER_SITE_OTHER): Payer: Medicare Other | Admitting: Family Medicine

## 2016-01-07 ENCOUNTER — Other Ambulatory Visit: Payer: Self-pay | Admitting: Family Medicine

## 2016-01-07 DIAGNOSIS — N183 Chronic kidney disease, stage 3 unspecified: Secondary | ICD-10-CM

## 2016-01-07 DIAGNOSIS — E209 Hypoparathyroidism, unspecified: Secondary | ICD-10-CM

## 2016-01-07 DIAGNOSIS — R600 Localized edema: Secondary | ICD-10-CM

## 2016-01-07 DIAGNOSIS — I509 Heart failure, unspecified: Secondary | ICD-10-CM | POA: Diagnosis not present

## 2016-01-07 MED ORDER — FUROSEMIDE 10 MG/ML IJ SOLN
40.0000 mg | Freq: Once | INTRAMUSCULAR | Status: AC
  Administered 2016-01-07: 40 mg via INTRAMUSCULAR

## 2016-01-07 NOTE — Progress Notes (Signed)
Subjective:    CC:   HPI: 75 yo female with Adenocarcinoma of the lung  is here to follow-up on abnormal calcium level. He had a critically low value of 5.6. Normal range of 8.6-10.4. She did blood work last Friday and it came back with a critically low calcium level. She is feeling fine. She denies any muscle pain or cramping. He also presented acutely with onset of lower extremity swelling that was fairly abrupt. She been having to turn her oxygen up to 5 L that she says it was because the cord was so long that she likes sitting on her porch with her oxygen. Her BNP was elevated at 746. I given her a low-dose of hydrochlorothiazide to start before I had her lab results back because she is so thin and petite and frail. She also has chronic any disease stage III with a creatinine of 1.25.  She is on chronic oxygen therapy.    Past medical history, Surgical history, Family history not pertinant except as noted below, Social history, Allergies, and medications have been entered into the medical record, reviewed, and corrections made.   Review of Systems: No fevers, chills, night sweats, weight loss, chest pain, or shortness of breath.   Objective:    General: Well Developed, well nourished, and in no acute distress.  Neuro: Alert and oriented x3, extra-ocular muscles intact, sensation grossly intact.  HEENT: Normocephalic, atraumatic  Skin: Warm and dry, no rashes. Cardiac: Regular rate and rhythm, no murmurs rubs or gallops, no lower extremity edema.  Respiratory: Clear to auscultation bilaterally. Not using accessory muscles, speaking in full sentences. Ext: she has 1+ edema of both ankles and on the tops of her feet.   Impression and Recommendations:   New onset congestive heart failure with elevated BNP. We'll get her scheduled for echocardiogram for further evaluation of ejection fraction and systolic function. We did go ahead and give her an injection of Lasix today to try to pull some  extra fluid off. There have her increase her hydrochlorothiazide 25 mg. That we may need to put her on Lasix. She is just so thin and frail but I'm worried about easily dehydrating her since she artery has stage III renal disease. Next  CKD 3-see note above.  Hypocalcemia-we will recheck blood level today. Repeat calcium level was 6.4 with parathyroid hormone level of 5.  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 Causes include immune destruction of the gland, destruction due to invasive tumor, radiation or low magnesium.  I will call her oncologist. Actually left a message with Dr. Georgiann Cocker today at 5 PM on October 17. She is out of the office until tomorrow morning. But I left a message for her to give me a call back so we can figure out if we need to do further workup or possibly get her in with endocrinology or possibly scan her neck for tumor invasion into the parathyroid glands.

## 2016-01-07 NOTE — Addendum Note (Signed)
Addended by: Beatrice Lecher D on: 01/07/2016 01:06 PM   Modules accepted: Orders

## 2016-01-08 ENCOUNTER — Encounter: Payer: Self-pay | Admitting: Family Medicine

## 2016-01-08 LAB — PTH, INTACT AND CALCIUM
Calcium: 6.4 mg/dL — ABNORMAL LOW (ref 8.6–10.4)
PTH: 5 pg/mL — AB (ref 14–64)

## 2016-01-09 LAB — MAGNESIUM: Magnesium: 1.7 mg/dL (ref 1.5–2.5)

## 2016-01-10 ENCOUNTER — Telehealth: Payer: Self-pay | Admitting: *Deleted

## 2016-01-10 DIAGNOSIS — I509 Heart failure, unspecified: Secondary | ICD-10-CM

## 2016-01-11 ENCOUNTER — Encounter: Payer: Self-pay | Admitting: Family Medicine

## 2016-01-11 NOTE — Telephone Encounter (Signed)
Called pt to inform her that she has appts w/Dr. Georgiann Cocker on 11/10 @ 945 AM for her PET scan and f/u on 11/16 at 800 AM w/Dr. Georgiann Cocker to go over her results.   Pt told that Dr. Madilyn Fireman wants her to go for labs on Monday and that the orders have been faxed.Stacey Grimes [place]

## 2016-01-14 ENCOUNTER — Encounter: Payer: Self-pay | Admitting: Family Medicine

## 2016-01-14 ENCOUNTER — Other Ambulatory Visit: Payer: Self-pay

## 2016-01-14 ENCOUNTER — Telehealth: Payer: Self-pay | Admitting: Family Medicine

## 2016-01-14 DIAGNOSIS — I272 Pulmonary hypertension, unspecified: Secondary | ICD-10-CM | POA: Insufficient documentation

## 2016-01-14 DIAGNOSIS — I509 Heart failure, unspecified: Secondary | ICD-10-CM

## 2016-01-14 DIAGNOSIS — I071 Rheumatic tricuspid insufficiency: Secondary | ICD-10-CM | POA: Insufficient documentation

## 2016-01-14 NOTE — Telephone Encounter (Signed)
Please call Stacey Grimes and let her know that overall her echocardiogram of the heart looked better than I thought. The only thing that we need to really address is that she had an elevated right ventricular systolic pressure which is consistent with something called pulmonary hypertension. Please see if she artery has a pulmonologist. I don't have it listed on her care team if not then we need to get her established for the pulmonary hypertension. We just need to make sure that we fax over the echo results from [facility] with the request.

## 2016-01-15 LAB — BASIC METABOLIC PANEL WITH GFR
BUN: 26 mg/dL — AB (ref 7–25)
CALCIUM: 6.1 mg/dL — AB (ref 8.6–10.4)
CO2: 30 mmol/L (ref 20–31)
Chloride: 101 mmol/L (ref 98–110)
Creat: 1.41 mg/dL — ABNORMAL HIGH (ref 0.60–0.93)
GFR, EST AFRICAN AMERICAN: 42 mL/min — AB (ref >=60)
GFR, EST NON AFRICAN AMERICAN: 37 mL/min — AB (ref >=60)
GLUCOSE: 94 mg/dL (ref 65–99)
Potassium: 5 mmol/L (ref 3.5–5.3)
Sodium: 144 mmol/L (ref 135–146)

## 2016-01-15 LAB — BRAIN NATRIURETIC PEPTIDE: Brain Natriuretic Peptide: 693.4 pg/mL — ABNORMAL HIGH (ref <100)

## 2016-01-16 ENCOUNTER — Other Ambulatory Visit (HOSPITAL_BASED_OUTPATIENT_CLINIC_OR_DEPARTMENT_OTHER): Payer: Medicare Other

## 2016-01-16 NOTE — Telephone Encounter (Signed)
Left voicemail informing pt of results and advised her to rtn call about results .Stacey Grimes

## 2016-01-21 NOTE — Telephone Encounter (Signed)
WE can fax over to Dr. Georgiann Cocker and try to get her in with pulmnology for the pulmonary hypertension.

## 2016-01-22 NOTE — Telephone Encounter (Signed)
Referral placed and given to referral coordinator for completion.

## 2016-02-05 ENCOUNTER — Ambulatory Visit: Payer: Medicare Other | Admitting: Family Medicine

## 2016-02-05 NOTE — Progress Notes (Unsigned)
Subjective:    CC:   HPI: New onset congestive heart failure with elevated BNP.Her cardiogram shows consistency with pulmonary hypertension. We did fax these results over to her oncologist. Started her on HCTZ. She is very thin and frail and is somewhat malnourished so I was hesitant to put her on furosemide.  Past medical history, Surgical history, Family history not pertinant except as noted below, Social history, Allergies, and medications have been entered into the medical record, reviewed, and corrections made.   Review of Systems: No fevers, chills, night sweats, weight loss, chest pain, or shortness of breath.   Objective:    General: Well Developed, well nourished, and in no acute distress.  Neuro: Alert and oriented x3, extra-ocular muscles intact, sensation grossly intact.  HEENT: Normocephalic, atraumatic  Skin: Warm and dry, no rashes. Cardiac: Regular rate and rhythm, no murmurs rubs or gallops, no lower extremity edema.  Respiratory: Clear to auscultation bilaterally. Not using accessory muscles, speaking in full sentences.   Impression and Recommendations:

## 2016-02-22 DEATH — deceased
